# Patient Record
Sex: Female | Born: 1980
Health system: Southern US, Community
[De-identification: ages and names within clinical notes are randomized; demographics above are authoritative.]

## PROBLEM LIST (undated history)

## (undated) DIAGNOSIS — B019 Varicella without complication: Secondary | ICD-10-CM

## (undated) DIAGNOSIS — E559 Vitamin D deficiency, unspecified: Secondary | ICD-10-CM

## (undated) DIAGNOSIS — E063 Autoimmune thyroiditis: Secondary | ICD-10-CM

## (undated) DIAGNOSIS — E079 Disorder of thyroid, unspecified: Secondary | ICD-10-CM

## (undated) DIAGNOSIS — M545 Low back pain, unspecified: Secondary | ICD-10-CM

## (undated) DIAGNOSIS — R51 Headache: Secondary | ICD-10-CM

## (undated) DIAGNOSIS — E669 Obesity, unspecified: Secondary | ICD-10-CM

## (undated) DIAGNOSIS — K760 Fatty (change of) liver, not elsewhere classified: Secondary | ICD-10-CM

## (undated) DIAGNOSIS — M542 Cervicalgia: Secondary | ICD-10-CM

## (undated) DIAGNOSIS — M719 Bursopathy, unspecified: Secondary | ICD-10-CM

## (undated) DIAGNOSIS — R519 Headache, unspecified: Secondary | ICD-10-CM

## (undated) DIAGNOSIS — G8929 Other chronic pain: Secondary | ICD-10-CM

## (undated) HISTORY — DX: Bursopathy, unspecified: M71.9

## (undated) HISTORY — DX: Headache: R51

## (undated) HISTORY — DX: Low back pain, unspecified: M54.50

## (undated) HISTORY — PX: NO PAST SURGERIES: SHX2092

## (undated) HISTORY — DX: Vitamin D deficiency, unspecified: E55.9

## (undated) HISTORY — DX: Other chronic pain: G89.29

## (undated) HISTORY — DX: Headache, unspecified: R51.9

## (undated) HISTORY — DX: Varicella without complication: B01.9

## (undated) HISTORY — DX: Cervicalgia: M54.2

## (undated) HISTORY — DX: Fatty (change of) liver, not elsewhere classified: K76.0

## (undated) HISTORY — DX: Autoimmune thyroiditis: E06.3

## (undated) HISTORY — DX: Low back pain: M54.5

## (undated) SURGERY — Surgical Case
Anesthesia: Spinal

---

## 2015-02-08 ENCOUNTER — Other Ambulatory Visit: Payer: Self-pay | Admitting: Obstetrics & Gynecology

## 2015-07-09 ENCOUNTER — Inpatient Hospital Stay: Payer: 59 | Admitting: Anesthesiology

## 2015-07-09 ENCOUNTER — Inpatient Hospital Stay
Admission: EM | Admit: 2015-07-09 | Discharge: 2015-07-12 | DRG: 765 | Disposition: A | Discharge status: Home / Self Care | Payer: 59 | Attending: Obstetrics and Gynecology | Admitting: Obstetrics and Gynecology

## 2015-07-09 ENCOUNTER — Encounter: Payer: Self-pay | Admitting: Certified Nurse Midwife

## 2015-07-09 ENCOUNTER — Encounter
Admission: EM | Disposition: A | Discharge status: Home / Self Care | Payer: Self-pay | Source: Home / Self Care | Attending: Obstetrics and Gynecology

## 2015-07-09 DIAGNOSIS — O99824 Streptococcus B carrier state complicating childbirth: Secondary | ICD-10-CM | POA: Diagnosis present

## 2015-07-09 DIAGNOSIS — O4290 Premature rupture of membranes, unspecified as to length of time between rupture and onset of labor, unspecified weeks of gestation: Secondary | ICD-10-CM | POA: Diagnosis present

## 2015-07-09 DIAGNOSIS — O99214 Obesity complicating childbirth: Secondary | ICD-10-CM | POA: Diagnosis present

## 2015-07-09 DIAGNOSIS — O9081 Anemia of the puerperium: Secondary | ICD-10-CM | POA: Diagnosis not present

## 2015-07-09 DIAGNOSIS — Z6841 Body Mass Index (BMI) 40.0 and over, adult: Secondary | ICD-10-CM | POA: Diagnosis not present

## 2015-07-09 DIAGNOSIS — E669 Obesity, unspecified: Secondary | ICD-10-CM | POA: Diagnosis present

## 2015-07-09 DIAGNOSIS — Z3A37 37 weeks gestation of pregnancy: Secondary | ICD-10-CM

## 2015-07-09 DIAGNOSIS — Z3A38 38 weeks gestation of pregnancy: Secondary | ICD-10-CM

## 2015-07-09 DIAGNOSIS — D62 Acute posthemorrhagic anemia: Secondary | ICD-10-CM | POA: Diagnosis not present

## 2015-07-09 DIAGNOSIS — O4292 Full-term premature rupture of membranes, unspecified as to length of time between rupture and onset of labor: Secondary | ICD-10-CM | POA: Diagnosis present

## 2015-07-09 HISTORY — DX: Obesity, unspecified: E66.9

## 2015-07-09 LAB — CBC
HCT: 35.8 % (ref 35.0–47.0)
Hemoglobin: 11.6 g/dL — ABNORMAL LOW (ref 12.0–16.0)
MCH: 27.6 pg (ref 26.0–34.0)
MCHC: 32.4 g/dL (ref 32.0–36.0)
MCV: 85.3 fL (ref 80.0–100.0)
PLATELETS: 260 10*3/uL (ref 150–440)
RBC: 4.2 MIL/uL (ref 3.80–5.20)
RDW: 14.1 % (ref 11.5–14.5)
WBC: 11.6 10*3/uL — ABNORMAL HIGH (ref 3.6–11.0)

## 2015-07-09 LAB — ABO/RH: ABO/RH(D): A POS

## 2015-07-09 LAB — TYPE AND SCREEN
ABO/RH(D): A POS
Antibody Screen: NEGATIVE

## 2015-07-09 SURGERY — Surgical Case
Anesthesia: Spinal | Wound class: Clean Contaminated

## 2015-07-09 MED ORDER — DIPHENHYDRAMINE HCL 50 MG/ML IJ SOLN: 12.5000 mg | INTRAMUSCULAR | Status: DC | PRN

## 2015-07-09 MED ORDER — LACTATED RINGERS IV SOLN
500.0000 mL | INTRAVENOUS | Status: DC | PRN
  Administered 2015-07-09: 1000 mL via INTRAVENOUS

## 2015-07-09 MED ORDER — OXYTOCIN 40 UNITS IN LACTATED RINGERS INFUSION - SIMPLE MED: 62.5000 mL/h | INTRAVENOUS | Status: DC

## 2015-07-09 MED ORDER — SIMETHICONE 80 MG PO CHEW
80.0000 mg | CHEWABLE_TABLET | ORAL | Status: DC
  Administered 2015-07-09 – 2015-07-10 (×2): 80 mg via ORAL
  Filled 2015-07-09 (×3): qty 1

## 2015-07-09 MED ORDER — OXYTOCIN BOLUS FROM INFUSION: 500.0000 mL | INTRAVENOUS | Status: DC

## 2015-07-09 MED ORDER — LACTATED RINGERS IV SOLN
INTRAVENOUS | Status: DC
  Administered 2015-07-10: 01:00:00 via INTRAVENOUS

## 2015-07-09 MED ORDER — BUPIVACAINE HCL (PF) 0.5 % IJ SOLN
INTRAMUSCULAR | Status: AC
  Filled 2015-07-09: qty 30

## 2015-07-09 MED ORDER — BUPIVACAINE IN DEXTROSE 0.75-8.25 % IT SOLN
INTRATHECAL | Status: DC | PRN
  Administered 2015-07-09: 1.8 mL via INTRATHECAL

## 2015-07-09 MED ORDER — NALBUPHINE HCL 10 MG/ML IJ SOLN
5.0000 mg | Freq: Once | INTRAMUSCULAR | Status: DC | PRN
  Filled 2015-07-09: qty 0.5

## 2015-07-09 MED ORDER — ONDANSETRON HCL 4 MG/2ML IJ SOLN: 4.0000 mg | Freq: Three times a day (TID) | INTRAMUSCULAR | Status: DC | PRN

## 2015-07-09 MED ORDER — PRENATAL MULTIVITAMIN CH
1.0000 | ORAL_TABLET | Freq: Every day | ORAL | Status: DC
  Administered 2015-07-10 – 2015-07-12 (×3): 1 via ORAL
  Filled 2015-07-09 (×3): qty 1

## 2015-07-09 MED ORDER — NALBUPHINE HCL 10 MG/ML IJ SOLN
5.0000 mg | INTRAMUSCULAR | Status: DC | PRN
  Filled 2015-07-09: qty 0.5

## 2015-07-09 MED ORDER — LACTATED RINGERS IV SOLN
INTRAVENOUS | Status: DC
Start: 2015-07-09 — End: 2015-07-09

## 2015-07-09 MED ORDER — BUPIVACAINE HCL (PF) 0.5 % IJ SOLN
INTRAMUSCULAR | Status: DC | PRN
  Administered 2015-07-09: 10 mL

## 2015-07-09 MED ORDER — ONDANSETRON HCL 4 MG/2ML IJ SOLN
4.0000 mg | Freq: Four times a day (QID) | INTRAMUSCULAR | Status: DC | PRN
  Administered 2015-07-09: 4 mg via INTRAVENOUS

## 2015-07-09 MED ORDER — CEFAZOLIN SODIUM-DEXTROSE 2-3 GM-% IV SOLR
INTRAVENOUS | Status: AC
  Filled 2015-07-09: qty 50

## 2015-07-09 MED ORDER — MEPERIDINE HCL 25 MG/ML IJ SOLN: 6.2500 mg | INTRAMUSCULAR | Status: DC | PRN

## 2015-07-09 MED ORDER — SIMETHICONE 80 MG PO CHEW
80.0000 mg | CHEWABLE_TABLET | ORAL | Status: DC | PRN
  Administered 2015-07-12 (×2): 80 mg via ORAL

## 2015-07-09 MED ORDER — DIPHENHYDRAMINE HCL 25 MG PO CAPS: 25.0000 mg | ORAL_CAPSULE | Freq: Four times a day (QID) | ORAL | Status: DC | PRN

## 2015-07-09 MED ORDER — PHENYLEPHRINE HCL 10 MG/ML IJ SOLN
INTRAMUSCULAR | Status: DC | PRN
  Administered 2015-07-09 (×2): 100 ug via INTRAVENOUS

## 2015-07-09 MED ORDER — INFLUENZA VAC SPLIT QUAD 0.5 ML IM SUSY: 0.5000 mL | PREFILLED_SYRINGE | INTRAMUSCULAR | Status: DC

## 2015-07-09 MED ORDER — DIPHENHYDRAMINE HCL 25 MG PO CAPS: 25.0000 mg | ORAL_CAPSULE | ORAL | Status: DC | PRN

## 2015-07-09 MED ORDER — SODIUM CHLORIDE 0.9 % IJ SOLN: 3.0000 mL | INTRAMUSCULAR | Status: DC | PRN

## 2015-07-09 MED ORDER — SIMETHICONE 80 MG PO CHEW
80.0000 mg | CHEWABLE_TABLET | Freq: Three times a day (TID) | ORAL | Status: DC
  Administered 2015-07-10 – 2015-07-12 (×6): 80 mg via ORAL
  Filled 2015-07-09 (×7): qty 1

## 2015-07-09 MED ORDER — BUPIVACAINE HCL 0.5 % IJ SOLN
10.0000 mL | Freq: Once | INTRAMUSCULAR | Status: DC
  Filled 2015-07-09: qty 10

## 2015-07-09 MED ORDER — NALOXONE HCL 0.4 MG/ML IJ SOLN: 0.4000 mg | INTRAMUSCULAR | Status: DC | PRN

## 2015-07-09 MED ORDER — SCOPOLAMINE 1 MG/3DAYS TD PT72: 1.0000 | MEDICATED_PATCH | Freq: Once | TRANSDERMAL | Status: DC

## 2015-07-09 MED ORDER — CEFAZOLIN SODIUM-DEXTROSE 2-3 GM-% IV SOLR: 2.0000 g | INTRAVENOUS | Status: DC

## 2015-07-09 MED ORDER — WITCH HAZEL-GLYCERIN EX PADS: 1.0000 "application " | MEDICATED_PAD | CUTANEOUS | Status: DC | PRN

## 2015-07-09 MED ORDER — MENTHOL 3 MG MT LOZG: 1.0000 | LOZENGE | OROMUCOSAL | Status: DC | PRN

## 2015-07-09 MED ORDER — MORPHINE SULFATE (PF) 0.5 MG/ML IJ SOLN
INTRAMUSCULAR | Status: DC | PRN
  Administered 2015-07-09: .2 mg via EPIDURAL

## 2015-07-09 MED ORDER — CITRIC ACID-SODIUM CITRATE 334-500 MG/5ML PO SOLN
30.0000 mL | ORAL | Status: AC
  Administered 2015-07-09: 30 mL via ORAL

## 2015-07-09 MED ORDER — IBUPROFEN 600 MG PO TABS
600.0000 mg | ORAL_TABLET | Freq: Four times a day (QID) | ORAL | Status: DC | PRN
  Administered 2015-07-10 – 2015-07-12 (×9): 600 mg via ORAL
  Filled 2015-07-09 (×9): qty 1

## 2015-07-09 MED ORDER — BUPIVACAINE 0.25 % ON-Q PUMP DUAL CATH 400 ML
400.0000 mL | INJECTION | Status: DC
  Filled 2015-07-09: qty 400

## 2015-07-09 MED ORDER — DIBUCAINE 1 % RE OINT: 1.0000 "application " | TOPICAL_OINTMENT | RECTAL | Status: DC | PRN

## 2015-07-09 MED ORDER — SENNOSIDES-DOCUSATE SODIUM 8.6-50 MG PO TABS
2.0000 | ORAL_TABLET | ORAL | Status: DC
  Administered 2015-07-09 – 2015-07-12 (×3): 2 via ORAL
  Filled 2015-07-09 (×3): qty 2

## 2015-07-09 MED ORDER — FENTANYL CITRATE (PF) 100 MCG/2ML IJ SOLN: 25.0000 ug | INTRAMUSCULAR | Status: DC | PRN

## 2015-07-09 MED ORDER — OXYTOCIN 40 UNITS IN LACTATED RINGERS INFUSION - SIMPLE MED
62.5000 mL/h | INTRAVENOUS | Status: DC
  Administered 2015-07-09: 800 mL via INTRAVENOUS
  Filled 2015-07-09: qty 1000

## 2015-07-09 MED ORDER — ONDANSETRON HCL 4 MG/2ML IJ SOLN: 4.0000 mg | Freq: Once | INTRAMUSCULAR | Status: DC | PRN

## 2015-07-09 MED ORDER — EPHEDRINE SULFATE 50 MG/ML IJ SOLN
INTRAMUSCULAR | Status: DC | PRN
  Administered 2015-07-09 (×2): 5 mg via INTRAVENOUS
  Administered 2015-07-09: 10 mg via INTRAVENOUS

## 2015-07-09 MED ORDER — ACETAMINOPHEN 325 MG PO TABS
650.0000 mg | ORAL_TABLET | ORAL | Status: DC | PRN
  Administered 2015-07-10 – 2015-07-12 (×7): 650 mg via ORAL
  Filled 2015-07-09 (×8): qty 2

## 2015-07-09 MED ORDER — NALOXONE HCL 2 MG/2ML IJ SOSY
1.0000 ug/kg/h | PREFILLED_SYRINGE | INTRAVENOUS | Status: DC | PRN
  Filled 2015-07-09: qty 2

## 2015-07-09 MED ORDER — MIDAZOLAM HCL 2 MG/2ML IJ SOLN
INTRAMUSCULAR | Status: DC | PRN
  Administered 2015-07-09 (×2): 1 mg via INTRAVENOUS

## 2015-07-09 MED ORDER — LANOLIN HYDROUS EX OINT: 1.0000 "application " | TOPICAL_OINTMENT | CUTANEOUS | Status: DC | PRN

## 2015-07-09 MED ORDER — CITRIC ACID-SODIUM CITRATE 334-500 MG/5ML PO SOLN
30.0000 mL | ORAL | Status: DC | PRN
  Filled 2015-07-09: qty 30

## 2015-07-09 SURGICAL SUPPLY — 28 items
BAG COUNTER SPONGE EZ (MISCELLANEOUS) ×2 IMPLANT
CANISTER SUCT 3000ML (MISCELLANEOUS) ×3 IMPLANT
CATH KIT ON-Q SILVERSOAK 5IN (CATHETERS) ×6 IMPLANT
CHLORAPREP W/TINT 26ML (MISCELLANEOUS) ×6 IMPLANT
CLOSURE WOUND 1/2 X4 (GAUZE/BANDAGES/DRESSINGS) ×1
COUNTER SPONGE BAG EZ (MISCELLANEOUS) ×1
DRSG TELFA 3X8 NADH (GAUZE/BANDAGES/DRESSINGS) ×3 IMPLANT
ELECT CAUTERY BLADE 6.4 (BLADE) ×3 IMPLANT
GAUZE SPONGE 4X4 12PLY STRL (GAUZE/BANDAGES/DRESSINGS) ×3 IMPLANT
GLOVE BIO SURGEON STRL SZ7 (GLOVE) ×3 IMPLANT
GLOVE INDICATOR 7.5 STRL GRN (GLOVE) ×3 IMPLANT
GOWN STRL REUS W/ TWL LRG LVL3 (GOWN DISPOSABLE) ×3 IMPLANT
GOWN STRL REUS W/TWL LRG LVL3 (GOWN DISPOSABLE) ×6
LIQUID BAND (GAUZE/BANDAGES/DRESSINGS) ×3 IMPLANT
NS IRRIG 1000ML POUR BTL (IV SOLUTION) ×3 IMPLANT
PACK C SECTION AR (MISCELLANEOUS) ×3 IMPLANT
PAD GROUND ADULT SPLIT (MISCELLANEOUS) ×3 IMPLANT
PAD OB MATERNITY 4.3X12.25 (PERSONAL CARE ITEMS) ×3 IMPLANT
PAD PREP 24X41 OB/GYN DISP (PERSONAL CARE ITEMS) ×3 IMPLANT
RTRCTR C-SECT PINK 25CM LRG (MISCELLANEOUS) ×3 IMPLANT
STAPLER INSORB 30 2030 C-SECTI (MISCELLANEOUS) ×3 IMPLANT
STRIP CLOSURE SKIN 1/2X4 (GAUZE/BANDAGES/DRESSINGS) ×2 IMPLANT
SUT MNCRL AB 4-0 PS2 18 (SUTURE) ×3 IMPLANT
SUT PDS AB 1 TP1 96 (SUTURE) ×6 IMPLANT
SUT VIC AB 0 CTX 36 (SUTURE) ×4
SUT VIC AB 0 CTX36XBRD ANBCTRL (SUTURE) ×2 IMPLANT
SUT VIC AB 2-0 CT1 36 (SUTURE) ×3 IMPLANT
SYR 5ML 18GX1 1/2 (NEEDLE) ×3 IMPLANT

## 2015-07-09 NOTE — Anesthesia Procedure Notes (Signed)
Spinal Patient location during procedure: OR Start time: 07/09/2015 9:50 AM End time: 07/09/2015 10:03 AM Staffing Anesthesiologist: Elijio MilesVAN STAVEREN, GIJSBERTUS F Performed by: anesthesiologist  Preanesthetic Checklist Completed: patient identified, site marked, surgical consent, pre-op evaluation, timeout performed, IV checked, risks and benefits discussed and monitors and equipment checked Spinal Block Patient position: sitting Prep: Betadine Patient monitoring: heart rate and blood pressure Location: L3-4 Injection technique: single-shot Needle Needle type: Quincke  Needle gauge: 25 G Needle length: 9 cm Needle insertion depth: 7 cm Assessment Sensory level: T6

## 2015-07-09 NOTE — Anesthesia Preprocedure Evaluation (Signed)
Anesthesia Evaluation  Patient identified by MRN, date of birth, ID band Patient awake    Reviewed: Allergy & Precautions, NPO status , Patient's Chart, lab work & pertinent test results  Airway Mallampati: II       Dental no notable dental hx.    Pulmonary neg pulmonary ROS,    Pulmonary exam normal        Cardiovascular negative cardio ROS Normal cardiovascular exam     Neuro/Psych negative neurological ROS     GI/Hepatic negative GI ROS, Neg liver ROS,   Endo/Other  negative endocrine ROS  Renal/GU negative Renal ROS     Musculoskeletal negative musculoskeletal ROS (+)   Abdominal Normal abdominal exam  (+)   Peds negative pediatric ROS (+)  Hematology negative hematology ROS (+)   Anesthesia Other Findings   Reproductive/Obstetrics negative OB ROS                             Anesthesia Physical Anesthesia Plan  ASA: II  Anesthesia Plan: Spinal   Post-op Pain Management:    Induction:   Airway Management Planned: Nasal Cannula  Additional Equipment:   Intra-op Plan:   Post-operative Plan:   Informed Consent: I have reviewed the patients History and Physical, chart, labs and discussed the procedure including the risks, benefits and alternatives for the proposed anesthesia with the patient or authorized representative who has indicated his/her understanding and acceptance.     Plan Discussed with: CRNA  Anesthesia Plan Comments:         Anesthesia Quick Evaluation

## 2015-07-09 NOTE — Transfer of Care (Signed)
Immediate Anesthesia Transfer of Care Note  Patient: Dana BubaStephanie D Haynes  Procedure(s) Performed: Procedure(s): CESAREAN SECTION (N/A)  Patient Location: Mother/Baby  Anesthesia Type:Spinal  Level of Consciousness: awake, alert , oriented and patient cooperative  Airway & Oxygen Therapy: Patient Spontanous Breathing  Post-op Assessment: Report given to RN, Post -op Vital signs reviewed and stable and Patient moving all extremities X 4  Post vital signs: Reviewed and stable  Last Vitals:  Filed Vitals:   07/09/15 1110  BP: 98/58  Pulse: 78  Temp: 36.4 C  Resp: 16    Complications: No apparent anesthesia complications

## 2015-07-09 NOTE — Progress Notes (Signed)
Patient present with PPROM at 37 weeks this morning.  Fetal monitoring revealed significant PAC's precluding adequate fetal monitoring because of artifact.   Given G1 in early stage of labor, inability to provide reassurance on fetal well being as labor progresses recommendation was for primary LTCS with patient in agreement.  The patient did have a normal fetal echo for paternal history of child with another partner with congential heart defect.  Good fetal movement reported by patient and felt by provider

## 2015-07-09 NOTE — Progress Notes (Signed)
Patient ordered Cefazolin pre-op with Pencillin allergy (hives).  Checked with provider- states continue as ordered.

## 2015-07-09 NOTE — H&P (Signed)
OB History & Physical   History of Present Illness:  Chief Complaint: My bag of water broke at 6AM. Denies contractions. Having some cramping  HPI:  Dana Brock is a 34 y.o. G1P0000 female with EDC11/05/2015 at 37.6 weeks dated by a 7wk1d ultrasound.  Her pregnancy has been complicated by obesity with current BMI=40.9, and elevated 1 hr GTT at 28 weeks of 142 and a normal 3 hr GTT, and FOB's history of a son with a congenital heart defect. This baby's fetal echo was WNL..  She presents to L&D for evaluation of leakage of clear fluid.    [facility]       Maternal Medical History:   Past Medical History  Diagnosis Date  . Obesity     Past Surgical History  Procedure Laterality Date  . No past surgeries      Allergies  Allergen Reactions  . Penicillins Hives    Prior to Admission medications   Not on File  Tums        Social History: She  reports that she has never smoked. She does not have any smokeless tobacco history on file. She reports that she does not drink alcohol or use illicit drugs.  Family History: family history is not on file.   Review of Systems: Negative x 10 systems reviewed except as noted in the HPI.      Physical Exam:  Vital Signs: BP 104/56 mmHg  Pulse 89  Temp(Src) 98.8 F (37.1 C) (Oral)  Resp 16  SpO2 100% General: no acute distress.  HEENT: normocephalic, atraumatic Heart: regular rate & rhythm.  No murmurs Lungs: clear to auscultation bilaterally Abdomen: soft, gravid, non-tender;  EFW:8# Pelvic:   External: Normal external female genitalia  Cervix: 1/60%/-3    ROM: + Nitrazine Extremities: non-tender, symmetric, trace edema bilaterally.  DTRs: +1 Neurologic: Alert & oriented x 3.    Pertinent Results:  Prenatal Labs: Blood type/Rh A positive  Antibody screen Not done  Rubella Varicella Immune Immune  RPR negative  HBsAg Not done  HIV negative  GC negative  Chlamydia  negative  Genetic screening declines  1 hour GTT 142  3 hour GTT WNL  GBS positive on 10/17   Baseline FHR: frequent irregularity to FHR, ?baseline 140 with possible decelerations to 80s Bedside Ultrasound:  Number of Fetus:one  Presentation: LOA    Assessment:  Dana Brock is a 34 y.o. G1P0000 female at 37.6 weeks with PROM and fetal heart rate irregularity   Plan:  1. Admit to Labor & Delivery - notified attending  Of FHR pattern 2. CBC, T&S,HBSAG, IVF 3. GBS positive.  Will use Kefzol 4. Consents obtained. 5. NPO  Mikel Hardgrove  07/09/2015 9:26 AM

## 2015-07-09 NOTE — Progress Notes (Signed)
Upon admission to unit, pt assessed and external monitor applied. Fetal heart tracing within normal range, this RN at bedside and arrhythmia heard during assessment. SVE performed, unable to reach cervix. Pt here with SROM at 0600, clear fluid. At 0840, C. Sharen HonesGutierrez, CNM at bedside for U/S, SVE and assessment. CLG req assistance with IV start for audible deceleration. RNs to room to start IV and assist CLG with FSE placement.  At 0900, A. Bonney AidStaebler, MD at bedside to assess FHR arrhythmia

## 2015-07-09 NOTE — Discharge Summary (Signed)
Obstetric Discharge Summary Reason for Admission: PROM Prenatal Procedures: NST Intrapartum Procedures: LTCS Postpartum Procedures: none Complications-Operative and Postpartum: none HEMOGLOBIN  Date Value Ref Range Status  07/10/2015 9.8* 12.0 - 16.0 g/dL Final   HCT  Date Value Ref Range Status  07/10/2015 29.9* 35.0 - 47.0 % Final    Physical Exam:  General: alert, appears stated age and no distress Lochia: appropriate Uterine Fundus: firm. C/d/i incision with dermabond and  with on q pump in place Incision: healing well DVT Evaluation: No evidence of DVT seen on physical exam.  Discharge Diagnoses: Term Pregnancy-delivered  Discharge Information: Date: 07/12/2015 Activity: no intercourse for 6 weeks, no heavy lifting greater than 10lbs for 6 weeks, no driving while taking narcotics or in the first 2 weeks after surgery Diet: routine   Medication List    TAKE these medications        calcium carbonate 500 MG chewable tablet  Commonly known as:  TUMS - dosed in mg elemental calcium  Chew 1 tablet by mouth 3 (three) times daily as needed for indigestion or heartburn.     docusate sodium 100 MG capsule  Commonly known as:  COLACE  Take 1 capsule (100 mg total) by mouth 2 (two) times daily as needed for mild constipation or moderate constipation.     ibuprofen 600 MG tablet  Commonly known as:  ADVIL,MOTRIN  Take 1 tablet (600 mg total) by mouth every 6 (six) hours as needed for mild pain.     oxyCODONE-acetaminophen 5-325 MG tablet  Commonly known as:  ROXICET  Take 1-2 tablets by mouth every 6 (six) hours as needed for severe pain.        Condition: stable Discharge to: home Follow-up Information    Follow up with Lorrene ReidSTAEBLER, ANDREAS M, MD In 1 week.   Specialty:  Obstetrics and Gynecology   Why:  For wound re-check, For Post Op   Contact information:   [address] [place] [address] [phone]       Follow up with Lorrene ReidSTAEBLER, ANDREAS M, MD In  1 week.   Specialty:  Obstetrics and Gynecology   Why:  For wound re-check   Contact information:   [address] [place] [address] [phone]       Newborn Data: Live born female  Birth Weight: 8 lb 5.7 oz (3790 g) APGAR: 9, 9  Home with mother.  Cornelia Copaharlie Cassondra Stachowski, Jr MD [facility]  Pager: [phone]

## 2015-07-09 NOTE — Op Note (Signed)
Preoperative Diagnosis: 1) 34 y.o. G1P1001 at 524w1d 2) Fetal arythmia  Postoperative Diagnosis: 1) 34 y.o. G1P1001 at 2324w1d 2) Fetal arythmia  Operation Performed: Primary low transverse C-section via pfannenstiel skin incision  Indication:Patient presented with PROM, significant PACs with possible decelerations noted on monitoring which precluded adequate assessment of fetal well being.  Anesthesia: Spinal  Primary Surgeon: Vena AustriaAndreas Demesha Boorman, MD  Assistant: Dalene Carrowolleen Guitierez, CNM  Preoperative Antibiotics: 2g ancef  Estimated Blood Loss: 500mL  IV Fluids: 1800mL  Urine Output:: 700mL  Drains or Tubes: Foley to gravity drainage, ON-Q catheter system  Implants: none  Specimens Removed: none  Complications: none  Intraoperative Findings:  Normal tubes ovaries and uterus.  Delivery at 10:11 resulted in the birth of a liveborn female infant (Olivia) APGAR (1 MIN): 9   APGAR (5 MINS): 9   Weight  3790g 8lbs 6oz  Patient Condition: stable  Procedure in Detail:  Patient was taken to the operating room were she was administered regional anesthesia.  She was positioned in the supine position, prepped and draped in the  Usual sterile fashion.  Prior to proceeding with the case a time out was performed and the level of anesthetic was checked and noted to be adequate.  Utilizing the scalpel a pfannenstiel skin incision was made 2cm above the pubic symphysis and carried down sharply to the the level of the rectus fascia.  The fascia was incised in the midline using the scalpel and then extended using mayo scissors.  The superior border of the rectus fascia was grasped with two Kocher clamps and the underlying rectus muscles were dissected of the fascia using blunt dissection.  The median raphae was incised using Mayo scissors.   The inferior border of the rectus fascia was dissected of the rectus muscles in a similar fashion.  The midline was identified, the peritoneum was entered  bluntly and expanded using manual tractions.  The uterus was noted to be in a none rotated position.  An large Teacher, early years/preAlexis retractor was placed.  A bladder flap was not created.  A low transverse incision was scored on the lower uterine segment.  The hysterotomy was entered bluntly using the operators finger.  The hysterotomy incision was extended using manual traction.  The operators hand was placed within the hysterotomy position noting the fetus to be within the OA position.  The vertex was grasped, flexed, brought to the incision, and delivered a traumatically using fundal pressure.  The remainder of the body delivered with ease.  The infant was suctioned, cord was clamped and cut before handing off to the awaiting neonatologist.  The placenta was delivered using manual extraction.  The uterus was exteriorized, wiped clean of clots and debris using two moist laps.  The hysterotomy was closed using a two layer closure of 0 Vicryl, with the first being a running locked, the second a vertical imbricating.  The uterus was returned to the abdomen.  The peritoneal gutters were wiped clean of clots and debris using two moist laps.  The hysterotomy incision was re-inspected noted to be hemostatic.  The rectus muscles were re-approximated in the midline using a single 2-0 Vicryl mattress stitch.  The rectus muscles were inspected noted to be hemostatic.  The superior border of the rectus fascia was grasped with a Kocher clamp.  The ON-Q trocars were then placed 4cm above the superior border of the incision and tunneled subfascially.  The introducers were removed and the catheters were threaded through the sleeves after which the  sleeves were removed.  The fascia was closed using a looped #1 PDS in a running fashion taking 1cm by 1cm bites.  The subcutaneous tissue was irrigated using warm saline, hemostasis achieved using the bovis.  The subcutaneous dead space was greater 3cm and was closed.  The subcutaneous dead space was  obliterated by using a 53-T 0 Chromic in a running fashion.     The skin was closed using Insorb staples.  Sponge needle and instrument counts were corrects times two.  The patient tolerated the procedure well and was taken to the recovery room in stable condition.

## 2015-07-10 LAB — CBC
HEMATOCRIT: 29.9 % — AB (ref 35.0–47.0)
HEMOGLOBIN: 9.8 g/dL — AB (ref 12.0–16.0)
MCH: 28 pg (ref 26.0–34.0)
MCHC: 32.9 g/dL (ref 32.0–36.0)
MCV: 85.1 fL (ref 80.0–100.0)
PLATELETS: 199 10*3/uL (ref 150–440)
RBC: 3.51 MIL/uL — AB (ref 3.80–5.20)
RDW: 14.1 % (ref 11.5–14.5)
WBC: 10.9 10*3/uL (ref 3.6–11.0)

## 2015-07-10 LAB — RPR: RPR Ser Ql: NONREACTIVE

## 2015-07-10 LAB — HEPATITIS B SURFACE ANTIGEN: HEP B S AG: NEGATIVE

## 2015-07-10 NOTE — Anesthesia Postprocedure Evaluation (Signed)
  Anesthesia Post-op Note  Patient: Dana [name]  Procedure(s) Performed: Procedure(s): CESAREAN SECTION (N/A)  Anesthesia type:Spinal  Patient location: PACU  Post pain: Pain level controlled  Post assessment: Post-op Vital signs reviewed, Patient's Cardiovascular Status Stable, Respiratory Function Stable, Patent Airway and No signs of Nausea or vomiting  Post vital signs: Reviewed and stable  Last Vitals:  Filed Vitals:   07/10/15 0735  BP: 117/61  Pulse: 77  Temp: 36.9 C  Resp: 18    Level of consciousness: awake, alert  and patient cooperative  Complications: No apparent anesthesia complications

## 2015-07-10 NOTE — Progress Notes (Signed)
Admit Date: 07/09/2015 Today's Date: 07/10/2015  Subjective: Postpartum Day 1: Cesarean Delivery Patient reports incisional pain, tolerating PO, + flatus and no problems voiding.    Objective: Vital signs in last 24 hours: Temp:  [97.6 F (36.4 C)-98.7 F (37.1 C)] 98.5 F (36.9 C) (10/26 0735) Pulse Rate:  [74-98] 77 (10/26 0735) Resp:  [16-20] 18 (10/26 0735) BP: (89-121)/(42-75) 117/61 mmHg (10/26 0735) SpO2:  [97 %-100 %] 98 % (10/26 0735) Weight:  [261 lb (118.389 kg)] 261 lb (118.389 kg) (10/25 1330)  Physical Exam:  General: alert, cooperative and no distress Lochia: appropriate Uterine Fundus: firm Incision: no significant drainage, no significant erythema, dressing clean and dry DVT Evaluation: No evidence of DVT seen on physical exam.   Recent Labs  07/09/15 0910 07/10/15 0448  HGB 11.6* 9.8*  HCT 35.8 29.9*    Assessment/Plan: Status post Cesarean section. Doing well postoperatively.  Continue current care. Monitor anemia; cont iron/PNV Uncertain contraception Breast feeding  Jennyfer Nickolson PAUL 07/10/2015, 9:10 AM

## 2015-07-10 NOTE — Anesthesia Post-op Follow-up Note (Signed)
  Anesthesia Pain Follow-up Note  Patient: Dana BubaStephanie D Haynes  Day #: 1  Date of Follow-up: 07/10/2015 Time: 9:54 AM  Last Vitals:  Filed Vitals:   07/10/15 0735  BP: 117/61  Pulse: 77  Temp: 36.9 C  Resp: 18    Level of Consciousness: alert  Pain: none   Side Effects:None  Catheter Site Exam:clean  Plan: D/C from anesthesia care  Evangeline Utley,  Wynona LunaPierre A

## 2015-07-11 MED ORDER — DOCUSATE SODIUM 100 MG PO CAPS
100.0000 mg | ORAL_CAPSULE | Freq: Every day | ORAL | Status: DC
  Administered 2015-07-11 – 2015-07-12 (×2): 100 mg via ORAL
  Filled 2015-07-11 (×2): qty 1

## 2015-07-11 MED ORDER — FERROUS FUMARATE 325 (106 FE) MG PO TABS
1.0000 | ORAL_TABLET | Freq: Every day | ORAL | Status: DC
  Administered 2015-07-11 – 2015-07-12 (×2): 1 via ORAL
  Filled 2015-07-11 (×2): qty 1

## 2015-07-11 NOTE — Lactation Note (Signed)
This note was copied from the chart of Dana Brock.  Lactation Consultation Note  Patient Name: Dana Brock [id number]'s Date: 07/11/2015 Reason for consult: Follow-up assessment   Maternal Data   Mother will call for a feeding today. Feeding Feeding Type: Breast Fed Length of feed: 20 min                    Consult Status  ongoing    Trudee GripCarolyn P Ashrita Chrismer 07/11/2015, 1:02 PM

## 2015-07-11 NOTE — Progress Notes (Signed)
  Subjective:   Passing flatus. Getting up and down with minimal assistance. Voiding without difficulty. Breastfeeding going well.  Objective:  Blood pressure 122/61, pulse 81, temperature 98.8 F (37.1 C), temperature source Oral, resp. rate 18, height 5\' 7"  (1.702 m), weight 261 lb (118.389 kg), last menstrual period 10/15/2014, SpO2 100 %, unknown if currently breastfeeding.  General: NAD Pulmonary: no increased work of breathing, CTA Abdomen: non-distended, non-tender,BS active. Incision: dsg C+D+I, ON Q intact Extremities: no edema, no erythema, no tenderness  Results for orders placed or performed during the hospital encounter of 07/09/15 (from the past 72 hour(s))  CBC     Status: Abnormal   Collection Time: 07/09/15  9:10 AM  Result Value Ref Range   WBC 11.6 (H) 3.6 - 11.0 K/uL   RBC 4.20 3.80 - 5.20 MIL/uL   Hemoglobin 11.6 (L) 12.0 - 16.0 g/dL   HCT 16.135.8 09.635.0 - 04.547.0 %   MCV 85.3 80.0 - 100.0 fL   MCH 27.6 26.0 - 34.0 pg   MCHC 32.4 32.0 - 36.0 g/dL   RDW 40.914.1 81.111.5 - 91.414.5 %   Platelets 260 150 - 440 K/uL  Type and screen [facility]     Status: None   Collection Time: 07/09/15  9:10 AM  Result Value Ref Range   ABO/RH(D) A POS    Antibody Screen NEG    Sample Expiration 07/12/2015   RPR     Status: None   Collection Time: 07/09/15  9:10 AM  Result Value Ref Range   RPR Ser Ql Non Reactive Non Reactive    Comment: (NOTE) Performed At: [facility] [address] [place], [place] [id number] Mila HomerHancock William F MD [id number]:[phone]   Hepatitis B surface antigen     Status: None   Collection Time: 07/09/15  9:10 AM  Result Value Ref Range   Hepatitis B Surface Ag Negative Negative    Comment: (NOTE) Performed At: [facility] [address] [place], [place] [id number] Mila HomerHancock William F MD [id number]:[phone]   ABO/Rh     Status: None   Collection Time: 07/09/15  9:11 AM  Result Value Ref Range   ABO/RH(D) A POS   CBC     Status:  Abnormal   Collection Time: 07/10/15  4:48 AM  Result Value Ref Range   WBC 10.9 3.6 - 11.0 K/uL   RBC 3.51 (L) 3.80 - 5.20 MIL/uL   Hemoglobin 9.8 (L) 12.0 - 16.0 g/dL   HCT 03.429.9 (L) 74.235.0 - 59.547.0 %   MCV 85.1 80.0 - 100.0 fL   MCH 28.0 26.0 - 34.0 pg   MCHC 32.9 32.0 - 36.0 g/dL   RDW 63.814.1 75.611.5 - 43.314.5 %   Platelets 199 150 - 440 K/uL     Assessment:   34 y.o. G1P1001 postoperativeday # 2-stable  Plan:  1) Acute blood loss anemia - hemodynamically stable and asymptomatic - po ferrous sulfate and vitamins  2)  / A POS / Varicella immune// Rubella Immune  3) TDAP UTD-given 9/2  4) Breast  5) Disposition-home tomorrow  Farrel ConnersGUTIERREZ, Alayasia Breeding, CNM

## 2015-07-12 MED ORDER — DOCUSATE SODIUM 100 MG PO CAPS: 100.0000 mg | ORAL_CAPSULE | Freq: Two times a day (BID) | ORAL | Status: DC | PRN

## 2015-07-12 MED ORDER — OXYCODONE-ACETAMINOPHEN 5-325 MG PO TABS: 1.0000 | ORAL_TABLET | Freq: Four times a day (QID) | ORAL | Status: DC | PRN

## 2015-07-12 MED ORDER — IBUPROFEN 600 MG PO TABS: 600.0000 mg | ORAL_TABLET | Freq: Four times a day (QID) | ORAL | Status: DC | PRN

## 2015-07-12 NOTE — Discharge Instructions (Signed)
Cesarean Delivery, Care After  Refer to this sheet in the next few weeks. These instructions provide you with information on caring for yourself after your procedure. Your health care provider may also give you specific instructions. Your treatment has been planned according to current medical practices, but problems sometimes occur. Call your health care provider if you have any problems or questions after you go home.  HOME CARE INSTRUCTIONS   Only take over-the-counter or prescription medications as directed by your health care provider.   Do not drink alcohol, especially if you are breastfeeding or taking medication to relieve pain.   Do not chew or smoke tobacco.   Continue to use good perineal care. Good perineal care includes:    Wiping your perineum from front to back.    Keeping your perineum clean.   Check your surgical cut (incision) daily for increased redness, drainage, swelling, or separation of skin.   Clean your incision gently with soap and water every day, and then pat it dry. If your health care provider says it is okay, leave the incision uncovered. Use a bandage (dressing) if the incision is draining fluid or appears irritated. If the adhesive strips across the incision do not fall off within 7 days, carefully peel them off.   Hug a pillow when coughing or sneezing until your incision is healed. This helps to relieve pain.   Do not use tampons or douche until your health care provider says it is okay.   Shower, wash your hair, and take tub baths as directed by your health care provider.   Wear a well-fitting bra that provides breast support.   Limit wearing support panties or control-top hose.   Drink enough fluids to keep your urine clear or pale yellow.   Eat high-fiber foods such as whole grain cereals and breads, brown rice, beans, and fresh fruits and vegetables every day. These foods may help prevent or relieve constipation.   Resume activities such as climbing stairs,  driving, lifting, exercising, or traveling as directed by your health care provider.   Talk to your health care provider about resuming sexual activities. This is dependent upon your risk of infection, your rate of healing, and your comfort and desire to resume sexual activity.   Try to have someone help you with your household activities and your newborn for at least a few days after you leave the hospital.   Rest as much as possible. Try to rest or take a nap when your newborn is sleeping.   Increase your activities gradually.   Keep all of your scheduled postpartum appointments. It is very important to keep your scheduled follow-up appointments. At these appointments, your health care provider will be checking to make sure that you are healing physically and emotionally.  SEEK MEDICAL CARE IF:    You are passing large clots from your vagina. Save any clots to show your health care provider.   You have a foul smelling discharge from your vagina.   You have trouble urinating.   You are urinating frequently.   You have pain when you urinate.   You have a change in your bowel movements.   You have increasing redness, pain, or swelling near your incision.   You have pus draining from your incision.   Your incision is separating.   You have painful, hard, or reddened breasts.   You have a severe headache.   You have blurred vision or see spots.   You feel sad   or depressed.   You have thoughts of hurting yourself or your newborn.   You have questions about your care, the care of your newborn, or medications.   You are dizzy or light-headed.   You have a rash.   You have pain, redness, or swelling at the site of the removed intravenous access (IV) tube.   You have nausea or vomiting.   You stopped breastfeeding and have not had a menstrual period within 12 weeks of stopping.   You are not breastfeeding and have not had a menstrual period within 12 weeks of delivery.   You have a fever.  SEEK  IMMEDIATE MEDICAL CARE IF:   You have persistent pain.   You have chest pain.   You have shortness of breath.   You faint.   You have leg pain.   You have stomach pain.   Your vaginal bleeding saturates 2 or more sanitary pads in 1 hour.  MAKE SURE YOU:    Understand these instructions.   Will watch your condition.   Will get help right away if you are not doing well or get worse.     This information is not intended to replace advice given to you by your health care provider. Make sure you discuss any questions you have with your health care provider.     Document Released: 05/23/2002 Document Revised: 09/21/2014 Document Reviewed: 04/27/2012  [employer]vier Interactive Patient Education 2016 [employer]

## 2015-07-12 NOTE — Lactation Note (Signed)
This note was copied from the chart of [name]. Lactation Consultation Note  Patient Name: [name] ZOXWR'UToday's Date: 07/12/2015 Reason for consult: Follow-up assessment   Maternal Data Does the patient have breastfeeding experience prior to this delivery?: No Mom for d/c today, has [employer] insurance and given Medela pump advanced for home use  Feeding Feeding Type: Breast Milk Length of feed: 30 min  LATCH Score/Interventions Latch: Grasps breast easily, tongue down, lips flanged, rhythmical sucking.  Audible Swallowing: Spontaneous and intermittent Intervention(s): Skin to skin;Hand expression  Type of Nipple: Everted at rest and after stimulation Intervention(s): No intervention needed  Comfort (Breast/Nipple): Filling, red/small blisters or bruises, mild/mod discomfort  Problem noted: Filling;Mild/Moderate discomfort Interventions (Filling): Frequent nursing Interventions (Mild/moderate discomfort): Comfort gels  Hold (Positioning): No assistance needed to correctly position infant at breast.  LATCH Score: 9  Lactation Tools [facility]   Consult Status      Dyann KiefMarsha D Jerlisa Diliberto 07/12/2015, 12:15 PM

## 2015-07-12 NOTE — Progress Notes (Signed)
Discharge instructions reviewed with pt. Pt v/u of all instructions. ID bands of mom and infant matched. Escorted by auxillary via w/c in stable condition, infant in arms.  Ruta HindsKelly Kailen Hinkle, RN 07/12/15 1500

## 2015-07-12 NOTE — Progress Notes (Signed)
Daily Post Partum Note  Dana Brock is a 34 y.o. G1P1001  POD#3 s/p pLTCS @ 4828w1d for fetal arrhythmia .  Pregnancy c/b nothing  24hr/overnight events:  none  Subjective:  Meeting all PO goals including BM  Objective:     Current Vital Signs 24h Vital Sign Ranges  T 98 F (36.7 C) Temp  Avg: 98.5 F (36.9 C)  Min: 98 F (36.7 C)  Max: 98.8 F (37.1 C)  BP 125/73 mmHg BP  Min: 115/64  Max: 138/73  HR 70 Pulse  Avg: 76  Min: 70  Max: 84  RR 18 Resp  Avg: 19.7  Min: 18  Max: 23  SaO2 99 % Not Delivered SpO2  Avg: 99.7 %  Min: 99 %  Max: 100 %       24 Hour I/O Current Shift I/O  Time Ins Outs 10/27 0701 - 10/28 0700 In: 720 [P.O.:720] Out: -       General: NAD Abdomen: soft, nttp, nd, +BS, on q in place. C/d/i with dermabond Perineum: deferred Skin:  Warm and dry.  Cardiovascular:Regular rate and rhythm. Respiratory:  Clear to auscultation bilateral. Normal respiratory effort Extremities: no c/c/e  Medications Current Facility-Administered Medications  Medication Dose Route Frequency Provider Last Rate Last Dose  . acetaminophen (TYLENOL) tablet 650 mg  650 mg Oral Q4H PRN Vena AustriaAndreas Staebler, MD   650 mg at 07/11/15 2246  . bupivacaine 0.25 % ON-Q pump DUAL CATH 400 mL  400 mL Other Continuous Vena AustriaAndreas Staebler, MD   400 mL at 07/09/15 1116  . witch hazel-glycerin (TUCKS) pad 1 application  1 application Topical PRN Vena AustriaAndreas Staebler, MD       And  . dibucaine (NUPERCAINAL) 1 % rectal ointment 1 application  1 application Rectal PRN Vena AustriaAndreas Staebler, MD      . diphenhydrAMINE (BENADRYL) injection 12.5 mg  12.5 mg Intravenous Q4H PRN Gijsbertus F Darleene CleaverVan Staveren, MD       Or  . diphenhydrAMINE (BENADRYL) capsule 25 mg  25 mg Oral Q4H PRN Gijsbertus Georgana CurioF Van Staveren, MD      . diphenhydrAMINE (BENADRYL) capsule 25 mg  25 mg Oral Q6H PRN Vena AustriaAndreas Staebler, MD      . docusate sodium (COLACE) capsule 100 mg  100 mg Oral Daily Farrel Connersolleen Gutierrez, CNM   100 mg at 07/12/15 0925   . ferrous fumarate (HEMOCYTE - 106 mg FE) tablet 106 mg of iron  1 tablet Oral Daily Farrel Connersolleen Gutierrez, CNM   1 tablet at 07/12/15 0925  . ibuprofen (ADVIL,MOTRIN) tablet 600 mg  600 mg Oral Q6H PRN Gijsbertus Georgana CurioF Van Staveren, MD   600 mg at 07/12/15 0653  . Influenza vac split quadrivalent PF (FLUARIX) injection 0.5 mL  0.5 mL Intramuscular Tomorrow-1000 Vena AustriaAndreas Staebler, MD      . lactated ringers infusion   Intravenous Continuous Vena AustriaAndreas Staebler, MD 125 mL/hr at 07/10/15 0118    . lanolin ointment 1 application  1 application Topical PRN Vena AustriaAndreas Staebler, MD      . menthol-cetylpyridinium (CEPACOL) lozenge 3 mg  1 lozenge Oral Q2H PRN Vena AustriaAndreas Staebler, MD      . nalbuphine (NUBAIN) injection 5 mg  5 mg Intravenous Q4H PRN Gijsbertus Georgana CurioF Van Staveren, MD       Or  . nalbuphine (NUBAIN) injection 5 mg  5 mg Subcutaneous Q4H PRN Gijsbertus F Darleene CleaverVan Staveren, MD      . nalbuphine (NUBAIN) injection 5 mg  5 mg Intravenous Once PRN Gijsbertus  Georgana Curio, MD       Or  . nalbuphine (NUBAIN) injection 5 mg  5 mg Subcutaneous Once PRN Gijsbertus F Darleene Cleaver, MD      . naloxone [facility]) 2 mg in dextrose 5 % 250 mL infusion  1-4 mcg/kg/hr Intravenous Continuous PRN Gijsbertus F Darleene Cleaver, MD      . naloxone [facility]) injection 0.4 mg  0.4 mg Intravenous PRN Gijsbertus Georgana Curio, MD       And  . sodium chloride 0.9 % injection 3 mL  3 mL Intravenous PRN Gijsbertus F Darleene Cleaver, MD      . ondansetron [facility]) injection 4 mg  4 mg Intravenous Q8H PRN Gijsbertus Georgana Curio, MD      . ondansetron [facility]) injection 4 mg  4 mg Intravenous Once PRN Gijsbertus Georgana Curio, MD      . prenatal multivitamin tablet 1 tablet  1 tablet Oral Q1200 Vena Austria, MD   1 tablet at 07/12/15 [phone]  . scopolamine (TRANSDERM-SCOP) 1 MG/3DAYS 1.5 mg  1 patch Transdermal Once Gijsbertus F Darleene Cleaver, MD      . senna-docusate (Senokot-S) tablet 2 tablet  2 tablet Oral Q24H Vena Austria, MD   2 tablet  at 07/12/15 0335  . simethicone (MYLICON) chewable tablet 80 mg  80 mg Oral TID PC Vena Austria, MD   80 mg at 07/12/15 0925  . simethicone (MYLICON) chewable tablet 80 mg  80 mg Oral Q24H Vena Austria, MD   80 mg at 07/10/15 2335  . simethicone (MYLICON) chewable tablet 80 mg  80 mg Oral PRN Vena Austria, MD   80 mg at 07/12/15 0335    Labs:   Recent Labs Lab 07/09/15 0910 07/10/15 0448  WBC 11.6* 10.9  HGB 11.6* 9.8*  HCT 35.8 29.9*  PLT 260 199   No results for input(s): NA, K, CL, CO2, BUN, CREATININE, LABGLOM, GLUCOSE, CALCIUM in the last 168 hours.  Assessment & Plan:  Pt doing well *Postpartum/postop: routine care *Dispo: home today  A POS / Rubella Immune / Varicella Immune/  RPR negative / HIV negative / HepBsAg negative / Tdap UTD: Yes/pap unknown / Breast  / Contraception: undecided / Follow up: Johnsie Brock. MD [facility] OBGYN Pager [phone]

## 2015-10-18 ENCOUNTER — Ambulatory Visit (INDEPENDENT_AMBULATORY_CARE_PROVIDER_SITE_OTHER): Payer: 59 | Admitting: Family Medicine

## 2015-10-18 ENCOUNTER — Encounter: Payer: Self-pay | Admitting: Family Medicine

## 2015-10-18 VITALS — BP 118/84 | HR 81 | Temp 98.6°F | Ht 67.0 in | Wt 247.4 lb

## 2015-10-18 DIAGNOSIS — F419 Anxiety disorder, unspecified: Secondary | ICD-10-CM | POA: Diagnosis not present

## 2015-10-18 DIAGNOSIS — E669 Obesity, unspecified: Secondary | ICD-10-CM | POA: Diagnosis not present

## 2015-10-18 DIAGNOSIS — R002 Palpitations: Secondary | ICD-10-CM | POA: Diagnosis not present

## 2015-10-18 DIAGNOSIS — R1011 Right upper quadrant pain: Secondary | ICD-10-CM

## 2015-10-18 DIAGNOSIS — G8929 Other chronic pain: Secondary | ICD-10-CM | POA: Insufficient documentation

## 2015-10-18 LAB — COMPREHENSIVE METABOLIC PANEL
ALK PHOS: 70 U/L (ref 39–117)
ALT: 18 U/L (ref 0–35)
AST: 18 U/L (ref 0–37)
Albumin: 4.3 g/dL (ref 3.5–5.2)
BILIRUBIN TOTAL: 0.5 mg/dL (ref 0.2–1.2)
BUN: 14 mg/dL (ref 6–23)
CALCIUM: 9.4 mg/dL (ref 8.4–10.5)
CO2: 28 mEq/L (ref 19–32)
CREATININE: 0.83 mg/dL (ref 0.40–1.20)
Chloride: 104 mEq/L (ref 96–112)
GFR: 84.5 mL/min (ref >60.00)
GLUCOSE: 89 mg/dL (ref 70–99)
Potassium: 4.1 mEq/L (ref 3.5–5.1)
Sodium: 139 mEq/L (ref 135–145)
TOTAL PROTEIN: 7.7 g/dL (ref 6.0–8.3)

## 2015-10-18 LAB — CBC
HEMATOCRIT: 40.5 % (ref 36.0–46.0)
Hemoglobin: 13.2 g/dL (ref 12.0–15.0)
MCHC: 32.6 g/dL (ref 30.0–36.0)
MCV: 86.5 fl (ref 78.0–100.0)
Platelets: 364 10*3/uL (ref 150.0–400.0)
RBC: 4.68 Mil/uL (ref 3.87–5.11)
RDW: 15.9 % — AB (ref 11.5–15.5)
WBC: 8 10*3/uL (ref 4.0–10.5)

## 2015-10-18 LAB — POCT URINE PREGNANCY: PREG TEST UR: NEGATIVE

## 2015-10-18 LAB — TSH: TSH: 3.47 u[IU]/mL (ref 0.35–4.50)

## 2015-10-18 LAB — HEMOGLOBIN A1C: HEMOGLOBIN A1C: 5.3 % (ref 4.6–6.5)

## 2015-10-18 LAB — LIPASE: LIPASE: 35 U/L (ref 11.0–59.0)

## 2015-10-18 NOTE — Assessment & Plan Note (Addendum)
Intermittent palpitations over the last year. Possibly mild difficulty getting a deep breath and when this occurs. No cardiac history. Does report a family history of thyroid dysfunction. Could be related to thyroid issue. Could be anxiety related as well. Asymptomatic at this time. EKG was reassuring. Doubt cardiac cause. Suspect most likely related to anxiety. We'll check TSH, CBC, and CMP. We'll continue to monitor. Given return precautions.

## 2015-10-18 NOTE — Progress Notes (Signed)
Pre visit review using our clinic review tool, if applicable. No additional management support is needed unless otherwise documented below in the visit note. 

## 2015-10-18 NOTE — Assessment & Plan Note (Addendum)
Chronic intermittent right upper quadrant pain that seems consistent with possible gallbladder pathology. Benign abdominal exam today. Could also be related to gastritis, reflux, or pancreatic issue. Will obtain ultrasound of her right upper quadrant. Discussed Tums for reflux. If ultrasound is negative we'll consider PPI for reflux. Check a CMP and lipase. Urine pregnancy tests negative. UA was checked inadvertently by the lab that revealed large blood and trace leukocytes. Negative nitrites. Patient denies urinary symptoms. Suspect blood and trace leukocytes are related to her being on her period. Otherwise UA was negative. Advised if she developed urinary symptoms she should let us know. Given return precautions.

## 2015-10-18 NOTE — Assessment & Plan Note (Signed)
GAD 7 score of 7. Is followed by a counselor. Declined medication for this. We'll continue to monitor. Given return precautions.

## 2015-10-18 NOTE — Patient Instructions (Signed)
Nice to meet you. We will check lab work today. We will also order an ultrasound of your abdomen to evaluate her gallbladder. If you develop persistent palpitations or shortness of breath, or develop chest pain, fevers, abdominal pain, or any new or changing symptoms please seek medical attention.

## 2015-10-18 NOTE — Progress Notes (Addendum)
Patient ID: Dana Brock, female   DOB: 1981/03/05, 35 y.o.   MRN: [id number]  Tommi Rumps, MD Phone: [phone]  Dana Brock is a 35 y.o. female who presents today for new patient visit  Patient notes periodic palpitations for about the last year. Notes it feels just like a flutter that last for a few seconds. Occasionally she will have a little bit of shortness of breath with this that she describes as being unable to quite take a deep breath. No chest pain with this. All this lasts only a few seconds. It is not exertional. She does note a history of anxiety and sees a Social worker. History of panic attacks in the past. She additionally notes some reflux that was worse when she was pregnant though not as bad now. Typically takes Tums for this. Is worse with fried food or [place] food. She does note intermittent right upper quadrant discomfort for the last several years. Notes that radiates to her right side. Can occur after eating fried foods. She notes no urinary complaints. She is on her period right now. She does note a family history of thyroid dysfunction. She notes mildly dry skin and has a history of eczema. No hair changes. She does note difficulty losing weight. She denies palpitations, shortness of breath, chest pain, abdominal pain, and anxiety at this time.  Active Ambulatory Problems    Diagnosis Date Noted  . Labor and delivery, indication for care 07/09/2015  . Palpitations 10/18/2015  . Abdominal pain, right upper quadrant 10/18/2015  . Anxiety 10/18/2015   Resolved Ambulatory Problems    Diagnosis Date Noted  . No Resolved Ambulatory Problems   Past Medical History  Diagnosis Date  . Obesity   . Chickenpox     Family History  Problem Relation Age of Onset  . Cancer - Other Mother   . Breast cancer      Grandmother  . Prostate cancer      Grandfather  . Hypertension      Parent  . ALS      Grandparent    Social History   Social History  .  Marital Status: Single    Spouse Name: N/A  . Number of Children: N/A  . Years of Education: N/A   Occupational History  . Not on file.   Social History Main Topics  . Smoking status: Former Research scientist (life sciences)  . Smokeless tobacco: Not on file  . Alcohol Use: 0.6 oz/week    1 Standard drinks or equivalent per week  . Drug Use: No  . Sexual Activity:    Partners: Male   Other Topics Concern  . Not on file   Social History Narrative    ROS   General:  Negative for nexplained weight loss, fever Skin: Negative for new or changing mole, sore that won't heal HEENT: Negative for trouble hearing, trouble seeing, ringing in ears, mouth sores, hoarseness, change in voice, dysphagia. CV:  Positive for dyspnea, palpitations, Negative for chest pain, edema Resp: Negative for cough, dyspnea, hemoptysis GI: Negative for nausea, vomiting, diarrhea, constipation, abdominal pain, melena, hematochezia. GU: Negative for dysuria, incontinence, urinary hesitance, hematuria, vaginal or penile discharge, polyuria, sexual difficulty, lumps in testicle or breasts MSK: Negative for muscle cramps or aches, joint pain or swelling Neuro: Negative for headaches, weakness, numbness, dizziness, passing out/fainting Psych: Positive anxiety, Negative for depression, memory problems  Objective  Physical Exam Filed Vitals:   10/18/15 0935  BP: 118/84  Pulse: 81  Temp: 98.6  F (37 C)    BP Readings from Last 3 Encounters:  10/18/15 118/84  07/12/15 123/87   Wt Readings from Last 3 Encounters:  10/18/15 247 lb 6.4 oz (112.22 kg)  07/09/15 261 lb (118.389 kg)    Physical Exam  Constitutional: She is well-developed, well-nourished, and in no distress.  HENT:  Head: Normocephalic and atraumatic.  Right Ear: External ear normal.  Left Ear: External ear normal.  Mouth/Throat: Oropharynx is clear and moist. No oropharyngeal exudate.  Eyes: Conjunctivae are normal. Pupils are equal, round, and reactive to  light.  Neck: Neck supple. No thyromegaly present.  Cardiovascular: Normal rate, regular rhythm and normal heart sounds.  Exam reveals no gallop and no friction rub.   No murmur heard. Pulmonary/Chest: Effort normal and breath sounds normal. No respiratory distress. She has no wheezes. She has no rales.  Abdominal: Soft. Bowel sounds are normal. She exhibits no distension and no mass. There is no tenderness. There is no rebound and no guarding.  Musculoskeletal: She exhibits no edema.  No lower leg tenderness or swelling  Lymphadenopathy:    She has no cervical adenopathy.  Neurological: She is alert. Gait normal.  Skin: Skin is warm and dry. She is not diaphoretic.  Psychiatric: Mood and affect normal.   EKG: Normal sinus rhythm, rate 76, no ST or T-wave changes, within normal limits  Assessment/Plan:   Palpitations Intermittent palpitations over the last year. Possibly mild difficulty getting a deep breath and when this occurs. No cardiac history. Does report a family history of thyroid dysfunction. Could be related to thyroid issue. Could be anxiety related as well. Asymptomatic at this time. EKG was reassuring. Doubt cardiac cause. Suspect most likely related to anxiety. We'll check TSH, CBC, and CMP. We'll continue to monitor. Given return precautions.  Abdominal pain, right upper quadrant Chronic intermittent right upper quadrant pain that seems consistent with possible gallbladder pathology. Benign abdominal exam today. Could also be related to gastritis, reflux, or pancreatic issue. Will obtain ultrasound of her right upper quadrant. Discussed Tums for reflux. If ultrasound is negative we'll consider PPI for reflux. Check a CMP and lipase. Urine pregnancy tests negative. UA was checked inadvertently by the lab that revealed large blood and trace leukocytes. Negative nitrites. Patient denies urinary symptoms. Suspect blood and trace leukocytes are related to her being on her period.  Otherwise UA was negative. Advised if she developed urinary symptoms she should let us know. Given return precautions.  Anxiety GAD 7 score of 7. Is followed by a counselor. Declined medication for this. We'll continue to monitor. Given return precautions.    Orders Placed This Encounter  Procedures  . US Abdomen Limited RUQ    Standing Status: Future     Number of Occurrences:      Standing Expiration Date: 12/15/2016    Order Specific Question:  Reason for Exam (SYMPTOM  OR DIAGNOSIS REQUIRED)    Answer:  RUQ discomfort, intermittent    Order Specific Question:  Preferred imaging location?    Answer:  [facility]  . Comp Met (CMET)  . TSH  . CBC  . HgB A1c  . Lipase  . POCT urine pregnancy  . EKG 12-Lead     Tommi Rumps

## 2015-10-21 ENCOUNTER — Encounter: Payer: Self-pay | Admitting: Family Medicine

## 2015-10-23 ENCOUNTER — Ambulatory Visit
Admission: RE | Admit: 2015-10-23 | Discharge: 2015-10-23 | Disposition: A | Discharge status: Home / Self Care | Payer: 59 | Source: Ambulatory Visit | Attending: Family Medicine | Admitting: Family Medicine

## 2015-10-23 DIAGNOSIS — R1011 Right upper quadrant pain: Secondary | ICD-10-CM | POA: Diagnosis not present

## 2015-10-23 DIAGNOSIS — R1013 Epigastric pain: Secondary | ICD-10-CM | POA: Insufficient documentation

## 2015-11-18 ENCOUNTER — Encounter: Payer: Self-pay | Admitting: Family Medicine

## 2015-11-18 ENCOUNTER — Ambulatory Visit (INDEPENDENT_AMBULATORY_CARE_PROVIDER_SITE_OTHER): Payer: 59 | Admitting: Family Medicine

## 2015-11-18 VITALS — BP 126/78 | HR 76 | Temp 98.1°F | Ht 67.0 in | Wt 251.1 lb

## 2015-11-18 DIAGNOSIS — S29011A Strain of muscle and tendon of front wall of thorax, initial encounter: Secondary | ICD-10-CM

## 2015-11-18 DIAGNOSIS — R002 Palpitations: Secondary | ICD-10-CM | POA: Diagnosis not present

## 2015-11-18 DIAGNOSIS — M549 Dorsalgia, unspecified: Secondary | ICD-10-CM | POA: Insufficient documentation

## 2015-11-18 DIAGNOSIS — F419 Anxiety disorder, unspecified: Secondary | ICD-10-CM

## 2015-11-18 DIAGNOSIS — K760 Fatty (change of) liver, not elsewhere classified: Secondary | ICD-10-CM

## 2015-11-18 DIAGNOSIS — M546 Pain in thoracic spine: Secondary | ICD-10-CM

## 2015-11-18 NOTE — Assessment & Plan Note (Signed)
Discomfort in left axilla area and exam most consistent with pectoral strain. Suspect likely related to carrying car seat in that arm exclusively. No chest pain to indicate cardiac cause or PE. No risk factors for cardiac or PE cause. Vital signs are stable. Discussed resting her left arm and pectoral muscle. Discussed as needed ibuprofen or Tylenol. She will continue to monitor. She is given return precautions.

## 2015-11-18 NOTE — Progress Notes (Signed)
Patient ID: Dana Brock, female   DOB: 11/15/1980, 35 y.o.   MRN: [id number]  Marikay AlarEric Reeanna Acri, MD Phone: [phone]  Dana Brock is a 35 y.o. female who presents today for  Follow-up.   anxiety: Notes this is well controlled at this time. Occurs intermittently. No depression, SI, or HI. See's a therapist for this issue.   Right-sided pain: This is improved. Notes it is more right mid back pain now. Comes and goes. Is a dull achy pain. Notes there are muscles that are tender and hurt when twisting. No abdominal pain at this time. Note she had a massage and the discomfort has improved. No numbness, weakness, bowel or bladder incontinence, saddle anesthesia, fevers, or history of cancer. She did have right upper quadrant ultrasound that did not reveal any gallstones though did reveal mild fatty liver.  Left armpit pain: Patient notes for the last month or so she's had intermittent discomfort near her left armpit. She notes it is a sharp discomfort intermittently. Improves with stretching. It is not exertional. No chest pain or shortness of breath. No history of blood clot, unilateral leg swelling, recent surgeries, or recent long trips. No specific injuries to the area. Notes she has been carrying her child's car seat in the left arm and this may have brought this about. No breast pain. Occasionally radiates to the inner aspect of her upper arm  Palpitations: Patient notes these are significantly improved. Still gets them intermittently. They occur when she is tired, hungry, or thirsty. She does note family history of thyroid dysfunction and notes that her family has had normal TSHs and abnormal T3 and T4. She would like to have these checked today. Prior workup is unrevealing for cause.  PMH: Former smoker.   ROS see history of present illness  Objective  Physical Exam Filed Vitals:   11/18/15 1509  BP: 126/78  Pulse: 76  Temp: 98.1 F (36.7 C)    BP Readings from Last 3  Encounters:  11/18/15 126/78  10/18/15 118/84  07/12/15 123/87   Wt Readings from Last 3 Encounters:  11/18/15 251 lb 2 oz (113.91 kg)  10/18/15 247 lb 6.4 oz (112.22 kg)  07/09/15 261 lb (118.389 kg)    Physical Exam  Constitutional: She is well-developed, well-nourished, and in no distress.  HENT:  Head: Normocephalic and atraumatic.  Right Ear: External ear normal.  Left Ear: External ear normal.  Mouth/Throat: Oropharynx is clear and moist. No oropharyngeal exudate.  Eyes: Conjunctivae are normal. Pupils are equal, round, and reactive to light.  Cardiovascular: Normal rate, regular rhythm and normal heart sounds.  Exam reveals no gallop and no friction rub.   No murmur heard. Pulmonary/Chest: Effort normal and breath sounds normal. No respiratory distress. She has no wheezes. She has no rales.  Abdominal: Soft. Bowel sounds are normal. She exhibits no distension. There is no tenderness. There is no rebound and no guarding.  Musculoskeletal: She exhibits no edema.  No midline spine tenderness, no midline spine step-off, mild right mid back muscular tenderness, no spasm or swelling in this area Tenderness palpation over left pectoral muscle near the insertion into axilla, no other pectoral muscle tenderness, no tenderness in her axilla or arm, full range of motion of shoulders, patient opted to stay in her tank top and bra though had no tenderness of her bilateral lateral breasts  Neurological: She is alert.  5 out of 5 strength bilateral quads, hamstrings, plantar flexion, and dorsiflexion, triceps, biceps, and grip  sensation light touch intact bilaterally upper and lower extremities, 2+ patellar reflexes  Skin: Skin is warm and dry. She is not diaphoretic.     Assessment/Plan: Please see individual problem list.  Anxiety Stable. Continue to follow with therapist. Given return precautions.  Palpitations Intermittent over the last year. Have improved since our last visit.  Asymptomatic at this time. Prior workup was unrevealing. We will check a free T3 and free T4 to complete the workup. Discussed if this continues to occur would consider cardiology evaluation. In return precautions.  Strain of left pectoralis muscle Discomfort in left axilla area and exam most consistent with pectoral strain. Suspect likely related to carrying car seat in that arm exclusively. No chest pain to indicate cardiac cause or PE. No risk factors for cardiac or PE cause. Vital signs are stable. Discussed resting her left arm and pectoral muscle. Discussed as needed ibuprofen or Tylenol. She will continue to monitor. She is given return precautions.  Back pain Suspect right-sided discomfort is related muscle strain in her back. Exam consistent with muscle strain. Neurologically intact. Discussed weight loss. Stay active to help with pain. Ibuprofen or Tylenol as needed. She'll continue to monitor. She is given return precautions.    Orders Placed This Encounter  Procedures  . T3, free  . T4, free  . Lipid Profile   Patient with minimal fatty liver noted on recent ultrasound. We'll check lipid panel. Patient reports having had hepatitis B vaccinations in the past.  Marikay Alar, MD [facility]

## 2015-11-18 NOTE — Assessment & Plan Note (Signed)
Stable. Continue to follow with therapist. Given return precautions.

## 2015-11-18 NOTE — Progress Notes (Signed)
Pre visit review using our clinic review tool, if applicable. No additional management support is needed unless otherwise documented below in the visit note. 

## 2015-11-18 NOTE — Patient Instructions (Addendum)
Nice to see you. We will check your thyroid hormones to ensure that they are normal. You can take ibuprofen 600 mg every 8 hours as needed for discomfort. If you develop abdominal pain, chest pain, shortness breath, palpitations, depression, worsening anxiety, thoughts of harming herself or others, or any new or changing symptoms please seek medical attention.

## 2015-11-18 NOTE — Assessment & Plan Note (Signed)
Intermittent over the last year. Have improved since our last visit. Asymptomatic at this time. Prior workup was unrevealing. We will check a free T3 and free T4 to complete the workup. Discussed if this continues to occur would consider cardiology evaluation. In return precautions.

## 2015-11-18 NOTE — Assessment & Plan Note (Signed)
Suspect right-sided discomfort is related muscle strain in her back. Exam consistent with muscle strain. Neurologically intact. Discussed weight loss. Stay active to help with pain. Ibuprofen or Tylenol as needed. She'll continue to monitor. She is given return precautions.

## 2015-11-19 LAB — LIPID PANEL
CHOLESTEROL: 173 mg/dL (ref 0–200)
HDL: 37.1 mg/dL — AB (ref >39.00)
LDL CALC: 101 mg/dL — AB (ref 0–99)
NonHDL: 135.69
Total CHOL/HDL Ratio: 5
Triglycerides: 174 mg/dL — ABNORMAL HIGH (ref 0.0–149.0)
VLDL: 34.8 mg/dL (ref 0.0–40.0)

## 2015-11-19 LAB — T4, FREE: FREE T4: 0.69 ng/dL (ref 0.60–1.60)

## 2015-11-19 LAB — T3, FREE: T3, Free: 3.1 pg/mL (ref 2.3–4.2)

## 2016-02-24 ENCOUNTER — Ambulatory Visit (INDEPENDENT_AMBULATORY_CARE_PROVIDER_SITE_OTHER): Payer: 59 | Admitting: Family Medicine

## 2016-02-24 ENCOUNTER — Encounter: Payer: Self-pay | Admitting: Family Medicine

## 2016-02-24 VITALS — BP 106/68 | HR 63 | Temp 98.0°F | Ht 67.0 in | Wt 249.8 lb

## 2016-02-24 DIAGNOSIS — E669 Obesity, unspecified: Secondary | ICD-10-CM | POA: Diagnosis not present

## 2016-02-24 DIAGNOSIS — M546 Pain in thoracic spine: Secondary | ICD-10-CM | POA: Diagnosis not present

## 2016-02-24 DIAGNOSIS — K760 Fatty (change of) liver, not elsewhere classified: Secondary | ICD-10-CM | POA: Diagnosis not present

## 2016-02-24 DIAGNOSIS — F419 Anxiety disorder, unspecified: Secondary | ICD-10-CM

## 2016-02-24 DIAGNOSIS — R002 Palpitations: Secondary | ICD-10-CM

## 2016-02-24 NOTE — Assessment & Plan Note (Signed)
Most likely related to muscular strain. Improving with physical activity and less of an issue now. Neurologically intact in her lower extremities. No red flags. She'll continue exercise. Continue to monitor. Given return precautions.

## 2016-02-24 NOTE — Patient Instructions (Signed)
Nice to see you. Congratulations on your diet and exercise. We will refer you to a nutritionist for additional help. We will check some hepatitis labs to ensure that you're immune. If you're not immune we will discuss given the vaccines given her fatty liver. Please continue to exercise and monitor her back discomfort. If this worsens or changes please let us know. If you develop worsened back pain, numbness, weakness, loss of bowel or bladder function, numbness of her legs, or any new or changing symptoms please seek medical attention.

## 2016-02-24 NOTE — Assessment & Plan Note (Signed)
Has not recurred. We'll continue to monitor.

## 2016-02-24 NOTE — Assessment & Plan Note (Signed)
Stable continue to see therapist

## 2016-02-24 NOTE — Assessment & Plan Note (Signed)
Abdomen prior ultrasound of right upper quadrant. Mild. Check hepatitis serologies to determine need for vaccinations.

## 2016-02-24 NOTE — Progress Notes (Signed)
Pre visit review using our clinic review tool, if applicable. No additional management support is needed unless otherwise documented below in the visit note. 

## 2016-02-24 NOTE — Assessment & Plan Note (Signed)
BMI of 39. Discussed diet and exercise. Congratulated her on losing 3 inches around her hips. Discussed that some of her inability to drop weight may be due to weight gain from her working out. We'll refer to nutritionist to discuss other options.

## 2016-02-24 NOTE — Progress Notes (Signed)
Patient ID: Dana Brock, female   DOB: Mar 30, 1981, 35 y.o.   MRN: [id number]  Marikay Alar, MD Phone: [phone]  Dana Brock is a 35 y.o. female who presents today for follow-up.  Anxiety: Patient notes this is stable and not a significant issue at this time. Continue see therapist. No depression. Not currently on any medications.  Palpitations: Have not recurred since her last visit.  Obesity: Patient notes she has started to eat healthier. Is meal planning. Eating mostly grilled chicken and vegetables. Has been doing [employer] that included cardio and weights. Has been doing this for about 6 weeks. Typically does this daily. Note she has not lost any weight though has lost 3 inches around her hips.  Fatty liver: She notes no right upper quadrant discomfort. Thinks she received hepatitis B vaccines when she was younger. Unsure about hepatitis A.  Right mid back pain: Patient notes this has improved some with her physical activity. Notes it occurs in her right mid to low back. Mostly after getting out of bed when she has stiffened up. It improved with movement. Occasional radiate down to her hips. No numbness, weakness, loss of bowel or bladder function, saddle anesthesia, fevers, or history of cancer.  PMH: Former smoker   ROS see history of present illness  Objective  Physical Exam Filed Vitals:   02/24/16 1355  BP: 106/68  Pulse: 63  Temp: 98 F (36.7 C)    BP Readings from Last 3 Encounters:  02/24/16 106/68  11/18/15 126/78  10/18/15 118/84   Wt Readings from Last 3 Encounters:  02/24/16 249 lb 12.8 oz (113.309 kg)  11/18/15 251 lb 2 oz (113.91 kg)  10/18/15 247 lb 6.4 oz (112.22 kg)    Physical Exam  Constitutional: She is well-developed, well-nourished, and in no distress.  HENT:  Head: Normocephalic and atraumatic.  Right Ear: External ear normal.  Left Ear: External ear normal.  Cardiovascular: Normal rate, regular rhythm and  normal heart sounds.   Pulmonary/Chest: Effort normal and breath sounds normal.  Abdominal: Soft. Bowel sounds are normal. She exhibits no distension. There is no tenderness. There is no rebound and no guarding.  Musculoskeletal:  No midline spine tenderness, no midline spine step-off, right paraspinous thoracic and lumbar spine tenderness, no overlying skin changes  Neurological: She is alert.  5 out of 5 strength bilateral quads, hamstrings, plantar flexion, and dorsiflexion, sensation light touch intact bilaterally lower extremities, absent patellar reflexes  Skin: Skin is warm and dry. She is not diaphoretic.     Assessment/Plan: Please see individual problem list.  Anxiety Stable continue to see therapist  Back pain Most likely related to muscular strain. Improving with physical activity and less of an issue now. Neurologically intact in her lower extremities. No red flags. She'll continue exercise. Continue to monitor. Given return precautions.  Palpitations Has not recurred. We'll continue to monitor.  Fatty liver Abdomen prior ultrasound of right upper quadrant. Mild. Check hepatitis serologies to determine need for vaccinations.  Obesity BMI of 39. Discussed diet and exercise. Congratulated her on losing 3 inches around her hips. Discussed that some of her inability to drop weight may be due to weight gain from her working out. We'll refer to nutritionist to discuss other options.    Orders Placed This Encounter  Procedures  . Hepatitis A Ab, Total  . Hepatitis B Surface AntiBODY  . Hepatitis B Core Antibody, total  . Hepatitis B Surface AntiGEN  . Amb ref  to Medical Nutrition Therapy-MNT    Referral Priority:  Routine    Referral Type:  Consultation    Referral Reason:  Specialty Services Required    Requested Specialty:  Nutrition    Number of Visits Requested:  1    Marikay AlarEric Sonnenberg, MD [facility] [place]

## 2016-02-25 LAB — HEPATITIS B CORE ANTIBODY, TOTAL: HEP B C TOTAL AB: NONREACTIVE

## 2016-02-25 LAB — HEPATITIS B SURFACE ANTIGEN: Hepatitis B Surface Ag: NEGATIVE

## 2016-02-25 LAB — HEPATITIS A ANTIBODY, TOTAL: HEP A TOTAL AB: NONREACTIVE

## 2016-02-25 LAB — HEPATITIS B SURFACE ANTIBODY,QUALITATIVE: HEP B S AB: POSITIVE — AB

## 2016-03-31 DIAGNOSIS — M722 Plantar fascial fibromatosis: Secondary | ICD-10-CM | POA: Diagnosis not present

## 2016-03-31 DIAGNOSIS — M79671 Pain in right foot: Secondary | ICD-10-CM | POA: Diagnosis not present

## 2016-04-03 ENCOUNTER — Encounter: Payer: 59 | Attending: Family Medicine | Admitting: Dietician

## 2016-04-03 ENCOUNTER — Encounter: Payer: Self-pay | Admitting: Dietician

## 2016-04-03 VITALS — Ht 67.0 in | Wt 248.2 lb

## 2016-04-03 DIAGNOSIS — E669 Obesity, unspecified: Secondary | ICD-10-CM | POA: Diagnosis not present

## 2016-04-03 NOTE — Patient Instructions (Signed)
   Eat something every 3-5 hours as much as you are able. Avoid getting overly hungry (which leads to overeating).   Choose healthy snack foods.   Plan ahead for balanced meals (lean protein, controlled starch portion, and a vegetable or fruit, or both). A sandwich with whole grain bread, lean meat/egg/pnut butter for protein, and lettuce and tomato, or carrot sticks, and a fruit is a balanced meal.   Some form of tracking food intake or progress on goals each day will help with ongoing weight loss.   Keep up your regular exercise, great job!

## 2016-04-03 NOTE — Progress Notes (Signed)
Medical Nutrition Therapy: Visit start time: 1315  end time: 1415  Assessment:  Diagnosis: obesity Past medical history: none significant  Psychosocial issues/ stress concerns: patient reports moderate stress level, does some stress eating Preferred learning method:  . Auditory . Hands-on  Current weight: 248.2lbs  Height: 5'7" Medications, supplements: no medications at this time  Progress and evaluation: Patient reports having dieted in the past with only short-term success.          She is finding weight loss more difficult since the birth of a baby last fall, and seeks help with healthy and sustainable eating plan.           Physical activity: walking 30 minutes, 4-5 days per week. Was doing exercise video until having heel pain.  Dietary Intake:  Usual eating pattern includes 2-3 meals and 1-2 snacks per day. Dining out frequency: 6-8 meals per week.  Breakfast: 7-8am when working, 10:30-11 when off: 2sl bacon, 2 boiled eggs, avocado; scrambled eggs with [place] or reg. Tomasa BlaseBacon, toast. Sometimes pancakes or french toast or egg sandwich on weekends. Used to eat oatmeal.  Snack: sometimes at work: banana or granola bar Lunch: 11:30 on workdays: [employer] cafeteria, or sandwich, salad, leftovers. When off: takeout food; or skips.  Snack: not consistent Supper: chicken in crock pot with vegetables, beans or rice; pork tenderloin; weekends often out; grilling burgers, hot dogs.  Snack: none on workdays; weekends sometimes chips or popcorn; occasionally ice cream, nothing consistent Beverages: water mostly. No sodas except for rare occasions, stopped sweet tea years ago. 1 c coffee in am.   Nutrition Care Education: Topics covered: weight management Basic nutrition: basic food groups, appropriate nutrient balance, appropriate meal and snack schedule, general nutrition guidelines    Weight control: determining reasonable weight goal, guidance for 1600kcal daily intake with 40% CHO, 30% protein,  30% fat; importance of lowfat and low sugar foods; portion control, basic meal planning; tracking food intake and weighing regularly; healthy meal and snack options.   Nutritional Diagnosis:  [place]-3.3 Overweight/obesity As related to history of frequent dieting, excess calories.  As evidenced by patient report.  Intervention: Instruction as noted above.   Patient will work on improving nutrient balance in her meals and snacks, some consistency in eating pattern (difficult due to work schedule).    Set goals with patient input.  Education Materials given:  . Food lists/ Planning A Balanced Meal . Sample meal pattern/ menus: Quick and Healthy Meal Ideas . Snacking handout . Goals/ instructions  Learner/ who was taught:  . Patient   Level of understanding: Marland Kitchen. Verbalizes/ demonstrates competency  Demonstrated degree of understanding via:   Teach back Learning barriers: . None  Willingness to learn/ readiness for change: . Acceptance, ready for change   Monitoring and Evaluation:  Dietary intake, exercise, and body weight      follow up: 05/01/16

## 2016-04-27 ENCOUNTER — Ambulatory Visit: Payer: 59 | Admitting: Family Medicine

## 2016-05-01 ENCOUNTER — Encounter: Payer: 59 | Attending: Family Medicine | Admitting: Dietician

## 2016-05-01 VITALS — Ht 67.0 in | Wt 247.0 lb

## 2016-05-01 DIAGNOSIS — E669 Obesity, unspecified: Secondary | ICD-10-CM | POA: Insufficient documentation

## 2016-05-01 NOTE — Progress Notes (Signed)
Medical Nutrition Therapy: Visit start time: 8016  end time: 5537  Assessment:  Diagnosis: obesity Medical history changes: no changes Psychosocial issues/ stress concerns: none  Current weight: 247.0lbs  Height: 5'7" Medications, supplement changes: no changes  Progress and evaluation: Patient reports ongoing struggle to lose weight, despite making positive diet and lifestyle changes.  She has met goals set at initial visit, other than tracking food intake. She reports 1 weekend vacation, but otherwise has adhered to planned, balanced meals. Some meals could still be low in carbohydrate.    Physical activity: walking 30 minutes, 4-5 times per week  Dietary Intake:  Usual eating pattern includes 3 meals and 1-2 snacks per day. Dining out frequency: not assessed today.  Breakfast: 2 scrambled eggs with [place] sausage, tomato, mushrooms Snack: maybe fruit Lunch: salad with baked chicken Snack: occasionally peanut butter crackers Supper: recently [place], cabbage Snack: usually none Beverages: water, coffee  Nutrition Care Education: Topics covered: weight management Basic nutrition: appropriate nutrient balance    Weight control: dealing with weight loss plateaus, low metabolism; reviewed benefits of tracking food intake, regular weighing   Nutritional Diagnosis:  [place]-3.3 Overweight/obesity As related to history of frequent dieting, excess calories.  As evidenced by patient report.  Intervention: Discussion as noted above.   Set new goals with patient direction.    She will be participating in a weight loss challenge at work for the next 6 weeks.   Education Materials given:  Marland Kitchen Goals/ instructions  Learner/ who was taught:  . Patient   Level of understanding: Marland Kitchen Verbalizes/ demonstrates competency   Demonstrated degree of understanding via:   Teach back Learning barriers: . None  Willingness to learn/ readiness for change: . Eager, change in  progress  Monitoring and Evaluation:  Dietary intake, exercise, and body weight      follow up: 06/01/16

## 2016-05-01 NOTE — Patient Instructions (Signed)
   Continue with regular exercise, add some strength training exercises during the day to increase muscle weight.   Track food intake to monitor calories, try MyFitnessPal, or LoseIt which are free. Keep to 1200 calories at a minimum, up to the 1600 recommended.

## 2016-05-11 ENCOUNTER — Encounter: Payer: Self-pay | Admitting: Family Medicine

## 2016-05-11 ENCOUNTER — Ambulatory Visit (INDEPENDENT_AMBULATORY_CARE_PROVIDER_SITE_OTHER): Payer: 59 | Admitting: Family Medicine

## 2016-05-11 VITALS — BP 112/70 | HR 83 | Temp 97.9°F | Wt 243.0 lb

## 2016-05-11 DIAGNOSIS — M722 Plantar fascial fibromatosis: Secondary | ICD-10-CM | POA: Diagnosis not present

## 2016-05-11 DIAGNOSIS — R21 Rash and other nonspecific skin eruption: Secondary | ICD-10-CM | POA: Diagnosis not present

## 2016-05-11 DIAGNOSIS — L309 Dermatitis, unspecified: Secondary | ICD-10-CM

## 2016-05-11 DIAGNOSIS — M546 Pain in thoracic spine: Secondary | ICD-10-CM | POA: Diagnosis not present

## 2016-05-11 DIAGNOSIS — R0789 Other chest pain: Secondary | ICD-10-CM | POA: Diagnosis not present

## 2016-05-11 DIAGNOSIS — E669 Obesity, unspecified: Secondary | ICD-10-CM

## 2016-05-11 NOTE — Assessment & Plan Note (Signed)
Patient's history and exam most consistent with musculoskeletal chest pain. Suspect possibly costochondritis versus muscular strain. Doubt cardiac cause or VTE given lack of history and lack of risk factors and stable vital signs. Discussed anti-inflammatory use with ibuprofen over-the-counter. She'll continue to monitor. Given return precautions.

## 2016-05-11 NOTE — Progress Notes (Signed)
  Dana AlarEric Kaori Jumper, MD Phone: [phone]  Dana Brock is a 35 y.o. female who presents today for follow-up.  Obesity: Has seen a nutritionist and has started eating more protein and vegetables. Not exercising.  Eczema: Patient notes on her right hand she has had significant eczematous rash over the last several months. Was previously treating with some kind of cream though is unsure what medication this was. Was previously followed by dermatology for this. Wants a referral to dermatology.  Musculoskeletal chest discomfort: Patient notes in her upper chest she has had a soreness and achiness especially if she stretches her neck back. She noted this first this morning. Has been sore to touch. No shortness of breath. No radiation. No exertional component. No unilateral leg swelling. No history of diabetes, hypertension, or hyperlipidemia. No family history of cardiac disease. Does note some mild back discomfort as well which has been a chronic intermittent issue. Notes the back discomfort is worse in the morning after waking up and gets better throughout the day. No numbness or weakness or loss of bowel or bladder function or saddle anesthesia.  PMH: Former smoker.   ROS see history of present illness  Objective  Physical Exam Vitals:   05/11/16 1109  BP: 112/70  Pulse: 83  Temp: 97.9 F (36.6 C)    BP Readings from Last 3 Encounters:  05/11/16 112/70  02/24/16 106/68  11/18/15 126/78   Wt Readings from Last 3 Encounters:  05/11/16 243 lb (110.2 kg)  05/01/16 247 lb (112 kg)  04/03/16 248 lb 3.2 oz (112.6 kg)    Physical Exam  Constitutional: No distress.  HENT:  Head: Normocephalic and atraumatic.  Cardiovascular: Normal rate, regular rhythm and normal heart sounds.   Pulmonary/Chest: Effort normal and breath sounds normal. She exhibits tenderness (bilateral costochondral joints tender to palpation).  Musculoskeletal:  No midline spine tenderness, no midline spine  step-off, mild right thoracic muscular tenderness  Neurological: She is alert. Gait normal.  5/5 strength in bilateral biceps, triceps, grip, quads, hamstrings, plantar and dorsiflexion, sensation to light touch intact in bilateral UE and LE, normal gait, 2+ patellar reflexes  Skin: Skin is warm and dry. She is not diaphoretic.  Patient with patch of scaly skin with skin breakdown and cracking on her right lateral dorsal hand     Assessment/Plan: Please see individual problem list.  Back pain Suspect continues to be related to muscular strain. No red flags. Neurologically intact in her upper and lower extremities. Continue exercise. Can use ibuprofen given return precautions.  Musculoskeletal chest pain Patient's history and exam most consistent with musculoskeletal chest pain. Suspect possibly costochondritis versus muscular strain. Doubt cardiac cause or VTE given lack of history and lack of risk factors and stable vital signs. Discussed anti-inflammatory use with ibuprofen over-the-counter. She'll continue to monitor. Given return precautions.  Eczema Referred to dermatology.  Obesity Is down 8 pounds. Encouraged continued weight loss with diet and exercise.   Orders Placed This Encounter  Procedures  . Ambulatory referral to Dermatology    Referral Priority:   Routine    Referral Type:   Consultation    Referral Reason:   Specialty Services Required    Requested Specialty:   Dermatology    Number of Visits Requested:   1    Dana AlarEric Cressida Milford, MD [facility] [place]

## 2016-05-11 NOTE — Assessment & Plan Note (Signed)
Referred to dermatology 

## 2016-05-11 NOTE — Assessment & Plan Note (Signed)
Suspect continues to be related to muscular strain. No red flags. Neurologically intact in her upper and lower extremities. Continue exercise. Can use ibuprofen given return precautions.

## 2016-05-11 NOTE — Progress Notes (Signed)
Pre visit review using our clinic review tool, if applicable. No additional management support is needed unless otherwise documented below in the visit note. 

## 2016-05-11 NOTE — Patient Instructions (Signed)
Nice to see you. You likely have inflammation in your joints and muscles in your chest.  You can take ibuprofen 600 mg every 8 hours as needed. Take this with food. Please continue to work on diet and exercise. We'll refer you to dermatology. If you have any change in your chest pain or develop shortness of breath, sweating, or any new or changing symptoms please seek medical attention.

## 2016-05-11 NOTE — Assessment & Plan Note (Signed)
Is down 8 pounds. Encouraged continued weight loss with diet and exercise.

## 2016-05-14 ENCOUNTER — Encounter: Payer: Self-pay | Admitting: Family Medicine

## 2016-06-01 ENCOUNTER — Ambulatory Visit: Payer: 59 | Admitting: Dietician

## 2016-06-05 ENCOUNTER — Telehealth: Payer: Self-pay | Admitting: Dietician

## 2016-06-05 NOTE — Telephone Encounter (Signed)
Called patient to reschedule appointment which was missed on 06/01/16. Left voicemail message for her to call back.

## 2016-06-29 ENCOUNTER — Encounter: Payer: Self-pay | Admitting: Dietician

## 2016-06-29 NOTE — Progress Notes (Signed)
Have not heard back from patient to reschedule missed appointment. Sent discharge letter to MD. 

## 2016-08-10 ENCOUNTER — Ambulatory Visit: Payer: 59 | Admitting: Family Medicine

## 2016-08-28 ENCOUNTER — Ambulatory Visit: Payer: 59 | Admitting: Family Medicine

## 2016-09-16 DIAGNOSIS — G43109 Migraine with aura, not intractable, without status migrainosus: Secondary | ICD-10-CM | POA: Diagnosis not present

## 2016-11-11 ENCOUNTER — Encounter: Payer: Self-pay | Admitting: Physician Assistant

## 2016-11-11 ENCOUNTER — Ambulatory Visit: Payer: Self-pay | Admitting: Physician Assistant

## 2016-11-11 VITALS — BP 120/80 | HR 72 | Temp 98.4°F

## 2016-11-11 DIAGNOSIS — H10022 Other mucopurulent conjunctivitis, left eye: Secondary | ICD-10-CM

## 2016-11-11 MED ORDER — GENTAMICIN SULFATE 0.3 % OP SOLN: 1.0000 [drp] | OPHTHALMIC | 0 refills | Status: DC

## 2016-11-11 NOTE — Progress Notes (Signed)
   Subjective:Pink Eye    Patient ID: Dana Brock, female    DOB: 03/02/1981, 36 y.o.   MRN: [id number]  HPI Patient c/o left eye drainage and redness for 2 days. Awaken this morning with matted left eyelid. No vision loss. Patient do not wear contact lens. Denies URI S/S. No palliative measure for compliant.   Review of Systems    Negative except for compliant. Objective:   Physical Exam  HEENT remarkable for erythematous left conjunctiva. Dry secretion lateral left eye lids. PERRL and EOM intact.       Assessment & Plan:Conjunctivitis  Gentex eye drops and follow up e days if no improvement.

## 2016-11-26 ENCOUNTER — Telehealth: Payer: 59 | Admitting: Physician Assistant

## 2016-11-26 DIAGNOSIS — H109 Unspecified conjunctivitis: Secondary | ICD-10-CM

## 2016-11-26 NOTE — Progress Notes (Signed)
Based on what you shared with me it looks like you have a condition that should be evaluated in a face to face office visit.  You have already been assessed for this issue in office and symptoms are not improving with prescription medication. As such you need reassessment with examination to determine the best alternative treatment.   NOTE: Even if you have entered your credit card information for this eVisit, you will not be charged.   If you are having a true medical emergency please call 911.  If you need an urgent face to face visit, [facility] has four urgent care centers for your convenience.  If you need care fast and have a high deductible or no insurance consider:   [email or web address]  [phone]  [address], [address] [place], [place] [address] 8 am to 8 pm Monday-Friday 10 am to 4 pm Saturday-Sunday   The following sites will take your  insurance:    . [facility]  [phone] Get Driving Directions Find a Provider at this Location  [address] [place], [place] [address] . 10 am to 8 pm Monday-Friday . 12 pm to 8 pm Saturday-Sunday   . [facility]  [phone] Get Driving Directions Find a Provider at this Location  [address] [place], [place] [address] . 8 am to 8 pm Monday-Friday . 9 am to 6 pm Saturday . 11 am to 6 pm Sunday   . [facility] [place]  [phone] Get Driving Directions  [address] [place], [place] [address] . 8 am to 8 pm Monday-Friday . 8 am to 4 pm Saturday-Sunday   Your e-visit answers were reviewed by a board certified advanced clinical practitioner to complete your personal care plan.  Thank you for using e-Visits.

## 2016-11-27 ENCOUNTER — Ambulatory Visit (INDEPENDENT_AMBULATORY_CARE_PROVIDER_SITE_OTHER): Payer: 59 | Admitting: Family Medicine

## 2016-11-27 ENCOUNTER — Other Ambulatory Visit: Payer: Self-pay | Admitting: Family Medicine

## 2016-11-27 ENCOUNTER — Other Ambulatory Visit (INDEPENDENT_AMBULATORY_CARE_PROVIDER_SITE_OTHER): Payer: Self-pay

## 2016-11-27 ENCOUNTER — Encounter: Payer: Self-pay | Admitting: Family Medicine

## 2016-11-27 VITALS — BP 110/80 | HR 92 | Temp 99.0°F | Ht 67.0 in | Wt 246.8 lb

## 2016-11-27 DIAGNOSIS — R946 Abnormal results of thyroid function studies: Secondary | ICD-10-CM | POA: Diagnosis not present

## 2016-11-27 DIAGNOSIS — H1033 Unspecified acute conjunctivitis, bilateral: Secondary | ICD-10-CM | POA: Diagnosis not present

## 2016-11-27 DIAGNOSIS — Z0001 Encounter for general adult medical examination with abnormal findings: Secondary | ICD-10-CM | POA: Diagnosis not present

## 2016-11-27 DIAGNOSIS — R1031 Right lower quadrant pain: Secondary | ICD-10-CM | POA: Diagnosis not present

## 2016-11-27 DIAGNOSIS — H109 Unspecified conjunctivitis: Secondary | ICD-10-CM | POA: Insufficient documentation

## 2016-11-27 DIAGNOSIS — R7989 Other specified abnormal findings of blood chemistry: Secondary | ICD-10-CM

## 2016-11-27 LAB — COMPREHENSIVE METABOLIC PANEL
ALBUMIN: 4.7 g/dL (ref 3.5–5.2)
ALK PHOS: 47 U/L (ref 39–117)
ALT: 12 U/L (ref 0–35)
AST: 13 U/L (ref 0–37)
BUN: 16 mg/dL (ref 6–23)
CALCIUM: 9.6 mg/dL (ref 8.4–10.5)
CO2: 23 mEq/L (ref 19–32)
CREATININE: 0.83 mg/dL (ref 0.40–1.20)
Chloride: 104 mEq/L (ref 96–112)
GFR: 83.92 mL/min (ref >60.00)
Glucose, Bld: 99 mg/dL (ref 70–99)
POTASSIUM: 4 meq/L (ref 3.5–5.1)
Sodium: 134 mEq/L — ABNORMAL LOW (ref 135–145)
TOTAL PROTEIN: 7.9 g/dL (ref 6.0–8.3)
Total Bilirubin: 0.5 mg/dL (ref 0.2–1.2)

## 2016-11-27 LAB — T3, FREE: T3 FREE: 3.4 pg/mL (ref 2.3–4.2)

## 2016-11-27 LAB — TSH: TSH: 7.04 u[IU]/mL — ABNORMAL HIGH (ref 0.35–4.50)

## 2016-11-27 LAB — CBC
HEMATOCRIT: 43.7 % (ref 36.0–46.0)
Hemoglobin: 14.4 g/dL (ref 12.0–15.0)
MCHC: 32.9 g/dL (ref 30.0–36.0)
MCV: 89.5 fl (ref 78.0–100.0)
Platelets: 311 10*3/uL (ref 150.0–400.0)
RBC: 4.88 Mil/uL (ref 3.87–5.11)
RDW: 13.4 % (ref 11.5–15.5)
WBC: 9 10*3/uL (ref 4.0–10.5)

## 2016-11-27 LAB — HEMOGLOBIN A1C: HEMOGLOBIN A1C: 5.6 % (ref 4.6–6.5)

## 2016-11-27 LAB — POCT URINE PREGNANCY: Preg Test, Ur: NEGATIVE

## 2016-11-27 LAB — T4, FREE: FREE T4: 0.77 ng/dL (ref 0.60–1.60)

## 2016-11-27 MED ORDER — POLYMYXIN B-TRIMETHOPRIM 10000-0.1 UNIT/ML-% OP SOLN
1.0000 [drp] | Freq: Four times a day (QID) | OPHTHALMIC | 0 refills | Status: DC
Start: 2016-11-27 — End: 2017-04-23

## 2016-11-27 NOTE — Patient Instructions (Signed)
Nice to see you. Please continue to work on your diet. Please start exercising again. Please get set up for follow-up with your gynecologist. We will try a new eyedrop and if this is not beneficial we need you to see your eye doctor. We will obtain some lab work that will evaluate your abdominal discomfort. It is likely related to constipation and if needed you can try MiraLAX over-the-counter. If you develop abdominal pain, vision changes, eye pain, or any new or changing symptoms please seek medical attention immediately.

## 2016-11-27 NOTE — Assessment & Plan Note (Signed)
Patient with bilateral conjunctivitis initially responding to gentamicin drops. No obvious purulent discharge at this time. We'll cover for bacterial conjunctivitis with Polytrim. Could be allergic or viral in nature. If not improving with Polytrim she will follow-up with her ophthalmologist.

## 2016-11-27 NOTE — Addendum Note (Signed)
Addended by: Glori LuisSONNENBERG, Judiann Celia G on: 11/27/2016 04:20 PM   Modules accepted: Orders

## 2016-11-27 NOTE — Progress Notes (Signed)
Pre visit review using our clinic review tool, if applicable. No additional management support is needed unless otherwise documented below in the visit note. 

## 2016-11-27 NOTE — Assessment & Plan Note (Signed)
Physical exam completed. Discussed diet and exercise. Pelvic and breast exams done through gynecology. She was encouraged to set up a follow-up appointment with them for her yearly exam given her mother's history of uterine cancer. We'll obtain lab work as outlined below. Vaccinations up-to-date.

## 2016-11-27 NOTE — Progress Notes (Signed)
Tommi Rumps, MD Phone: [phone]  Dana Brock is a 36 y.o. female who presents today for physical exam.  She is not exercising. She has changed to a ketogenic diet for the last 2 weeks and is down about 6 pounds. Tetanus vaccination and flu vaccination up-to-date. Pap smear reportedly done in December 2016. Followed by gynecology. She notes regular periods. HIV testing in 2016 with the birth of her daughter. No tobacco use, alcohol use, or illicit drug use.  Notes her mother has a history of uterine cancer in her 51s. Her paternal grandmother had breast cancer. Her paternal grandfather had prostate cancer.  Patient has been dealing with pinkeye for some time now. She's been treated with 2 rounds of gentamicin eyedrops. Started in the left eye and did initially improve with the gentamicin though recurred and is now on both eyes and has not improved with gentamicin this time. She notes no eye pain. No vision changes. No upper respiratory symptoms. Notes the eyes itch and are matted shut in the morning. She notes no photophobia.  Patient reports about a week ago she had a days worth of right lower quadrant abdominal pain. She has had no pelvic discomfort. Was constant. It went away. Has not recurred. No nausea, vomiting, diarrhea, dysuria, urinary symptoms, or vaginal discharge. She notes no pain at this time. She has been more constipated than usual since going on the ketogenic diet though does typically have a bowel movement once a day. Some days she does not have bowel movements. LMP was 11/04/16.  Active Ambulatory Problems    Diagnosis Date Noted  . Palpitations 10/18/2015  . Abdominal pain, right upper quadrant 10/18/2015  . Anxiety 10/18/2015  . Strain of left pectoralis muscle 11/18/2015  . Back pain 11/18/2015  . Fatty liver 02/24/2016  . Obesity 02/24/2016  . Musculoskeletal chest pain 05/11/2016  . Eczema 05/11/2016  . Encounter for general adult medical examination  with abnormal findings 11/27/2016  . RLQ abdominal pain 11/27/2016  . Conjunctivitis 11/27/2016   Resolved Ambulatory Problems    Diagnosis Date Noted  . Labor and delivery, indication for care 07/09/2015   Past Medical History:  Diagnosis Date  . Chickenpox   . Obesity     Family History  Problem Relation Age of Onset  . Cancer - Other Mother   . Breast cancer      Grandmother  . Prostate cancer      Grandfather  . Hypertension      Parent  . ALS      Grandparent    Social History   Social History  . Marital status: Single    Spouse name: N/A  . Number of children: N/A  . Years of education: N/A   Occupational History  . Not on file.   Social History Main Topics  . Smoking status: Former Research scientist (life sciences)  . Smokeless tobacco: Never Used  . Alcohol use 0.6 oz/week    1 Standard drinks or equivalent per week  . Drug use: No  . Sexual activity: Yes    Partners: Male   Other Topics Concern  . Not on file   Social History Narrative  . No narrative on file    ROS  General:  Negative for nexplained weight loss, fever Skin: Negative for new or changing mole, sore that won't heal HEENT: Negative for trouble hearing, trouble seeing, ringing in ears, mouth sores, hoarseness, change in voice, dysphagia. CV:  Negative for chest pain, dyspnea, edema, palpitations Resp:  Negative for cough, dyspnea, hemoptysis GI: Positive for abdominal pain, Negative for nausea, vomiting, diarrhea, constipation, melena, hematochezia. GU: Negative for dysuria, incontinence, urinary hesitance, hematuria, vaginal or penile discharge, polyuria, sexual difficulty, lumps in testicle or breasts MSK: Negative for muscle cramps or aches, joint pain or swelling Neuro: Negative for headaches, weakness, numbness, dizziness, passing out/fainting Psych: Negative for depression, anxiety, memory problems  Objective  Physical Exam Vitals:   11/27/16 1047  BP: 110/80  Pulse: 92  Temp: 99 F (37.2 C)      BP Readings from Last 3 Encounters:  11/27/16 110/80  11/11/16 120/80  05/11/16 112/70   Wt Readings from Last 3 Encounters:  11/27/16 246 lb 12.8 oz (111.9 kg)  05/11/16 243 lb (110.2 kg)  05/01/16 247 lb (112 kg)    Physical Exam  Constitutional: No distress.  HENT:  Head: Normocephalic and atraumatic.  Mouth/Throat: Oropharynx is clear and moist. No oropharyngeal exudate.  Eyes: Pupils are equal, round, and reactive to light. Right eye exhibits no discharge. Left eye exhibits no discharge.  Bilateral eyes with conjunctival erythema, no discharge noted, no gross corneal defects  Neck: Neck supple.  Cardiovascular: Normal rate, regular rhythm and normal heart sounds.   Pulmonary/Chest: Effort normal and breath sounds normal.  Abdominal: Soft. Bowel sounds are normal. She exhibits no distension. There is no tenderness. There is no rebound and no guarding.  Musculoskeletal: She exhibits no edema.  Lymphadenopathy:    She has no cervical adenopathy.  Neurological: She is alert. Gait normal.  Skin: Skin is warm and dry. She is not diaphoretic.  Psychiatric: Mood and affect normal.     Assessment/Plan:   Encounter for general adult medical examination with abnormal findings Physical exam completed. Discussed diet and exercise. Pelvic and breast exams done through gynecology. She was encouraged to set up a follow-up appointment with them for her yearly exam given her mother's history of uterine cancer. We'll obtain lab work as outlined below. Vaccinations up-to-date.  RLQ abdominal pain Patient with single episode of right lower quadrant pain. Has resolved. Has not recurred. Benign exam today. Urine pregnancy test is negative. Suspect related to constipation. We will obtain lab work as outlined below to evaluate for potential causes. She's given return precautions.  Conjunctivitis Patient with bilateral conjunctivitis initially responding to gentamicin drops. No obvious  purulent discharge at this time. We'll cover for bacterial conjunctivitis with Polytrim. Could be allergic or viral in nature. If not improving with Polytrim she will follow-up with her ophthalmologist.   Orders Placed This Encounter  Procedures  . Comp Met (CMET)  . CBC  . HgB A1c  . TSH  . POCT urine pregnancy    Meds ordered this encounter  Medications  . trimethoprim-polymyxin b (POLYTRIM) ophthalmic solution    Sig: Place 1 drop into both eyes every 6 (six) hours.    Dispense:  10 mL    Refill:  0     Tommi Rumps, MD [place]

## 2016-11-27 NOTE — Assessment & Plan Note (Addendum)
Patient with single episode of right lower quadrant pain. Has resolved. Has not recurred. Benign exam today. Urine pregnancy test is negative. Suspect related to constipation. We will obtain lab work as outlined below to evaluate for potential causes. She's given return precautions.

## 2016-11-30 ENCOUNTER — Telehealth: Payer: Self-pay | Admitting: Family Medicine

## 2016-11-30 NOTE — Telephone Encounter (Signed)
Pt called back returning your call. Thank you!  Call pt @ [phone]

## 2016-11-30 NOTE — Telephone Encounter (Signed)
CMA handled this already, thanks

## 2016-12-01 ENCOUNTER — Other Ambulatory Visit: Payer: Self-pay | Admitting: Family Medicine

## 2016-12-01 DIAGNOSIS — R7989 Other specified abnormal findings of blood chemistry: Secondary | ICD-10-CM

## 2016-12-07 DIAGNOSIS — H1033 Unspecified acute conjunctivitis, bilateral: Secondary | ICD-10-CM | POA: Diagnosis not present

## 2016-12-10 DIAGNOSIS — H1033 Unspecified acute conjunctivitis, bilateral: Secondary | ICD-10-CM | POA: Diagnosis not present

## 2016-12-25 DIAGNOSIS — H1033 Unspecified acute conjunctivitis, bilateral: Secondary | ICD-10-CM | POA: Diagnosis not present

## 2017-01-01 ENCOUNTER — Other Ambulatory Visit (INDEPENDENT_AMBULATORY_CARE_PROVIDER_SITE_OTHER): Payer: 59

## 2017-01-01 DIAGNOSIS — R7989 Other specified abnormal findings of blood chemistry: Secondary | ICD-10-CM

## 2017-01-01 DIAGNOSIS — R946 Abnormal results of thyroid function studies: Secondary | ICD-10-CM

## 2017-01-01 LAB — TSH: TSH: 10.54 u[IU]/mL — ABNORMAL HIGH (ref 0.35–4.50)

## 2017-01-04 ENCOUNTER — Telehealth: Payer: Self-pay

## 2017-01-04 DIAGNOSIS — E039 Hypothyroidism, unspecified: Secondary | ICD-10-CM

## 2017-01-04 MED ORDER — LEVOTHYROXINE SODIUM 75 MCG PO TABS: 75.0000 ug | ORAL_TABLET | Freq: Every day | ORAL | 3 refills | Status: DC

## 2017-01-04 NOTE — Telephone Encounter (Signed)
Sent to pharmacy. She needs a repeat TSH in 4-6 weeks.

## 2017-01-04 NOTE — Telephone Encounter (Signed)
-----   Message from Glori Luis, MD sent at 01/04/2017 10:52 AM EDT ----- Please let the patient know that her TSH is still elevated. She would benefit from being on a medication for hypothyroidism. If she is willing I can send this to her pharmacy. Thanks.

## 2017-01-04 NOTE — Telephone Encounter (Signed)
Left message to return call 

## 2017-01-04 NOTE — Telephone Encounter (Signed)
Patient notified and would like the prescription sent to [facility]

## 2017-01-05 NOTE — Telephone Encounter (Signed)
Left detailed message to notify and give [place] a call to schedule lab appointment

## 2017-02-12 ENCOUNTER — Other Ambulatory Visit (INDEPENDENT_AMBULATORY_CARE_PROVIDER_SITE_OTHER): Payer: 59

## 2017-02-12 DIAGNOSIS — E039 Hypothyroidism, unspecified: Secondary | ICD-10-CM

## 2017-02-12 LAB — TSH: TSH: 5.56 u[IU]/mL — ABNORMAL HIGH (ref 0.35–4.50)

## 2017-03-09 ENCOUNTER — Encounter: Payer: Self-pay | Admitting: Family Medicine

## 2017-04-09 ENCOUNTER — Telehealth: Payer: Self-pay | Admitting: *Deleted

## 2017-04-09 MED ORDER — LEVOTHYROXINE SODIUM 75 MCG PO TABS: 75.0000 ug | ORAL_TABLET | Freq: Every day | ORAL | 0 refills | Status: DC

## 2017-04-09 NOTE — Telephone Encounter (Signed)
[facility], thanks

## 2017-04-09 NOTE — Telephone Encounter (Signed)
Patient has requested a medication refill for levothyroxine 30 day supply  [facility]

## 2017-04-23 ENCOUNTER — Encounter: Payer: Self-pay | Admitting: Family Medicine

## 2017-04-23 ENCOUNTER — Ambulatory Visit (INDEPENDENT_AMBULATORY_CARE_PROVIDER_SITE_OTHER): Payer: 59 | Admitting: Family Medicine

## 2017-04-23 VITALS — BP 112/80 | HR 84 | Temp 98.7°F | Wt 252.0 lb

## 2017-04-23 DIAGNOSIS — E039 Hypothyroidism, unspecified: Secondary | ICD-10-CM

## 2017-04-23 DIAGNOSIS — F419 Anxiety disorder, unspecified: Secondary | ICD-10-CM

## 2017-04-23 DIAGNOSIS — E038 Other specified hypothyroidism: Secondary | ICD-10-CM

## 2017-04-23 LAB — TSH: TSH: 2.67 u[IU]/mL (ref 0.35–4.50)

## 2017-04-23 MED ORDER — LEVOTHYROXINE SODIUM 50 MCG PO TABS: 50.0000 ug | ORAL_TABLET | Freq: Every day | ORAL | 3 refills | Status: DC

## 2017-04-23 NOTE — Assessment & Plan Note (Signed)
Has somewhat worsened though some of this may be related to some of the symptoms she is having possibly related to the Synthroid. I do think this will improve with the decreased dose of Synthroid. Encouraged her to see her counselor if needed. She is hesitant to try any medications for anxiety.

## 2017-04-23 NOTE — Progress Notes (Signed)
  Marikay AlarEric Artyom Stencel, MD Phone: [phone]  Dana Brock is a 36 y.o. female who presents today for f/u.  Subclinical hypothyroidism: Patient previously started on Synthroid after having elevated TSHs on several occasions. She had been having some fatigue and tiredness prior to going on Synthroid. Since going on the Synthroid she has felt intermittently jittery and dizzy. Also notes her appetite is increased and her heart rate has gone up at times. She's had some heat and cold intolerance. She notes some days she is fine and other days she has symptoms.  She notes her anxiety has gone to another level as well. She was doing counseling though has not seen a counselor in some time. She notes no medications for this. She has no depression. She does note she's been more emotional.  ROS see history of present illness  Objective  Physical Exam Vitals:   04/23/17 1000 04/23/17 1024  BP: 112/80   Pulse: (!) 114 84  Temp: 98.7 F (37.1 C)   SpO2: 95%    Laying blood pressure 126/84 pulse 81 Sitting blood pressure 120/80 pulse 89 Standing blood pressure 112/78 pulse 97  BP Readings from Last 3 Encounters:  04/23/17 112/80  11/27/16 110/80  11/11/16 120/80   Wt Readings from Last 3 Encounters:  04/23/17 252 lb (114.3 kg)  11/27/16 246 lb 12.8 oz (111.9 kg)  05/11/16 243 lb (110.2 kg)    Physical Exam  Constitutional: No distress.  HENT:  Normal TMs bilaterally  Cardiovascular: Normal rate, regular rhythm and normal heart sounds.   Pulmonary/Chest: Effort normal and breath sounds normal.  Musculoskeletal: She exhibits no edema.  Neurological: She is alert. Gait normal.  Negative Dix-Hallpike  Skin: She is not diaphoretic.    Assessment/Plan: Please see individual problem list.  Subclinical hypothyroidism Now with new symptoms. Based on her labs and prior fatigue. It appears her symptoms have swung the opposite direction from hypothyroidism. Suspect her current symptoms  are related to the Synthroid. We will check a TSH. We'll decrease her dose of Synthroid. We'll recheck in about a month. Discussed that it may take 4-6 weeks to notice a huge difference in her symptoms.  Anxiety Has somewhat worsened though some of this may be related to some of the symptoms she is having possibly related to the Synthroid. I do think this will improve with the decreased dose of Synthroid. Encouraged her to see her counselor if needed. She is hesitant to try any medications for anxiety.   Orders Placed This Encounter  Procedures  . TSH    Meds ordered this encounter  Medications  . levothyroxine (SYNTHROID, LEVOTHROID) 50 MCG tablet    Sig: Take 1 tablet (50 mcg total) by mouth daily.    Dispense:  30 tablet    Refill:  3   Marikay AlarEric Hedda Crumbley, MD [facility] [place]

## 2017-04-23 NOTE — Assessment & Plan Note (Addendum)
Now with new symptoms. Based on her labs and prior fatigue. It appears her symptoms have swung the opposite direction from hypothyroidism. Suspect her current symptoms are related to the Synthroid. We will check a TSH. We'll decrease her dose of Synthroid. We'll recheck in about a month. Discussed that it may take 4-6 weeks to notice a huge difference in her symptoms.

## 2017-04-23 NOTE — Patient Instructions (Signed)
Nice to see you. We are going to decrease your dose of Synthroid. I sent this to your pharmacy. This will hopefully help with your symptoms. It may take a number of weeks for them to significantly improve. Please monitor your anxiety and if this worsens please see your counselor or let us know.

## 2017-04-30 ENCOUNTER — Encounter: Payer: Self-pay | Admitting: Family Medicine

## 2017-04-30 ENCOUNTER — Telehealth: Payer: Self-pay | Admitting: Family Medicine

## 2017-04-30 NOTE — Telephone Encounter (Signed)
Attempted to call the patient regarding her my chart message. Per the patient's DPR I left a message advising that we responded her my chart message and she needed to check this.

## 2017-05-11 ENCOUNTER — Ambulatory Visit: Payer: Self-pay | Admitting: Physician Assistant

## 2017-05-11 ENCOUNTER — Encounter: Payer: Self-pay | Admitting: Physician Assistant

## 2017-05-11 VITALS — BP 114/80 | HR 66 | Temp 97.8°F

## 2017-05-11 DIAGNOSIS — M25552 Pain in left hip: Secondary | ICD-10-CM

## 2017-05-11 MED ORDER — MELOXICAM 15 MG PO TABS: 15.0000 mg | ORAL_TABLET | Freq: Every day | ORAL | 6 refills | Status: DC

## 2017-05-11 NOTE — Progress Notes (Signed)
S: c/o left hip pain for few days, happens on and off for awhile, now hurts to stand, hurts to turn leg out, no known injury, some numbness in upper thigh from her back, didn't take any nsaids today  O: vitals wnl, nad, left hip is a little tender in groin area, decreased rom with internal rotation, legs = length, no rotation of foot, n/ vintact  A: hip pain  P: mobic 15mg  qd, will refer to ortho

## 2017-05-12 NOTE — Progress Notes (Signed)
Contacted  Ortho. Appointment scheduled with Dr. Darrelyn Hillock on 05/22/2017 @ 9:00 on Saturaday morning.  LM on patient vm of appointment day and time

## 2017-05-22 DIAGNOSIS — M25552 Pain in left hip: Secondary | ICD-10-CM | POA: Diagnosis not present

## 2017-05-28 ENCOUNTER — Ambulatory Visit: Payer: Self-pay | Admitting: Family Medicine

## 2017-06-02 ENCOUNTER — Telehealth: Payer: Self-pay | Admitting: *Deleted

## 2017-06-02 ENCOUNTER — Telehealth: Payer: Self-pay | Admitting: Family Medicine

## 2017-06-02 ENCOUNTER — Encounter (HOSPITAL_COMMUNITY): Payer: Self-pay | Admitting: *Deleted

## 2017-06-02 ENCOUNTER — Emergency Department (HOSPITAL_COMMUNITY): Payer: 59

## 2017-06-02 ENCOUNTER — Emergency Department (HOSPITAL_COMMUNITY)
Admission: EM | Admit: 2017-06-02 | Discharge: 2017-06-02 | Disposition: A | Discharge status: Home / Self Care | Payer: 59 | Attending: Emergency Medicine | Admitting: Emergency Medicine

## 2017-06-02 DIAGNOSIS — E039 Hypothyroidism, unspecified: Secondary | ICD-10-CM | POA: Diagnosis not present

## 2017-06-02 DIAGNOSIS — Z87891 Personal history of nicotine dependence: Secondary | ICD-10-CM | POA: Diagnosis not present

## 2017-06-02 DIAGNOSIS — Z79899 Other long term (current) drug therapy: Secondary | ICD-10-CM | POA: Insufficient documentation

## 2017-06-02 DIAGNOSIS — R079 Chest pain, unspecified: Secondary | ICD-10-CM | POA: Insufficient documentation

## 2017-06-02 DIAGNOSIS — R0789 Other chest pain: Secondary | ICD-10-CM | POA: Diagnosis not present

## 2017-06-02 DIAGNOSIS — R42 Dizziness and giddiness: Secondary | ICD-10-CM | POA: Diagnosis not present

## 2017-06-02 HISTORY — DX: Disorder of thyroid, unspecified: E07.9

## 2017-06-02 LAB — I-STAT BETA HCG BLOOD, ED (MC, WL, AP ONLY): I-stat hCG, quantitative: <5 m[IU]/mL (ref <5)

## 2017-06-02 LAB — URINALYSIS, ROUTINE W REFLEX MICROSCOPIC
Bilirubin Urine: NEGATIVE
GLUCOSE, UA: NEGATIVE mg/dL
Hgb urine dipstick: NEGATIVE
Ketones, ur: NEGATIVE mg/dL
Nitrite: NEGATIVE
Protein, ur: NEGATIVE mg/dL
Specific Gravity, Urine: 1.004 — ABNORMAL LOW (ref 1.005–1.030)
pH: 6 (ref 5.0–8.0)

## 2017-06-02 LAB — CBC
HCT: 41.6 % (ref 36.0–46.0)
Hemoglobin: 14.1 g/dL (ref 12.0–15.0)
MCH: 30.8 pg (ref 26.0–34.0)
MCHC: 33.9 g/dL (ref 30.0–36.0)
MCV: 90.8 fL (ref 78.0–100.0)
PLATELETS: 306 10*3/uL (ref 150–400)
RBC: 4.58 MIL/uL (ref 3.87–5.11)
RDW: 13.1 % (ref 11.5–15.5)
WBC: 8.6 10*3/uL (ref 4.0–10.5)

## 2017-06-02 LAB — BASIC METABOLIC PANEL
Anion gap: 11 (ref 5–15)
BUN: 11 mg/dL (ref 6–20)
CALCIUM: 9.3 mg/dL (ref 8.9–10.3)
CHLORIDE: 106 mmol/L (ref 101–111)
CO2: 22 mmol/L (ref 22–32)
CREATININE: 1.04 mg/dL — AB (ref 0.44–1.00)
GFR calc non Af Amer: >60 mL/min (ref >60)
Glucose, Bld: 105 mg/dL — ABNORMAL HIGH (ref 65–99)
Potassium: 4.1 mmol/L (ref 3.5–5.1)
SODIUM: 139 mmol/L (ref 135–145)

## 2017-06-02 LAB — TSH: TSH: 4.141 u[IU]/mL (ref 0.350–4.500)

## 2017-06-02 LAB — I-STAT TROPONIN, ED: TROPONIN I, POC: 0 ng/mL (ref 0.00–0.08)

## 2017-06-02 MED ORDER — MECLIZINE HCL 25 MG PO TABS
25.0000 mg | ORAL_TABLET | Freq: Once | ORAL | Status: DC
  Filled 2017-06-02: qty 1

## 2017-06-02 MED ORDER — MECLIZINE HCL 25 MG PO TABS: 25.0000 mg | ORAL_TABLET | Freq: Three times a day (TID) | ORAL | 0 refills | Status: DC | PRN

## 2017-06-02 NOTE — ED Triage Notes (Signed)
Pt reports ongoing dizziness, they thought it was related to thyroid medicine, they adjusted her dose and pt was feeling better. Having dizziness again and reports chest "soreness" more severe on left side and palpitations. ekg done at triage and no resp distress is noted.

## 2017-06-02 NOTE — Telephone Encounter (Signed)
[facility] [place] [facility] - [place] TELEPHONE ADVICE RECORD [facility]  Patient Name: Dana Brock  DOB: 14-May-1981    Initial Comment Pt is having dizziness and irregular heartbeat    Nurse Assessment  Nurse: Odis Luster, RN, Bjorn Loser Date/Time (Eastern Time): 06/02/2017 8:27:27 AM  Confirm and document reason for call. If symptomatic, describe symptoms. ---Pt is having dizziness and irregular heartbeat (last night). Reports that she has had this before and MD thought this was related to her thyroid. MD decreased her thyroid medication. She reports that she feels like she is not herself.  Does the patient have any new or worsening symptoms? ---Yes  Will a triage be completed? ---Yes  Related visit to physician within the last 2 weeks? ---No  Does the PT have any chronic conditions? (i.e. diabetes, asthma, etc.) ---Yes  List chronic conditions. ---hypothyroidism;  Is the patient pregnant or possibly pregnant? (Ask all females between the ages of 43-55) ---No  Is this a behavioral health or substance abuse call? ---No     Guidelines    Guideline Title Affirmed Question Affirmed Notes  Heart Rate and Heartbeat Questions Passed out (i.e., lost consciousness, collapsed and was not responding)    Final Disposition User   Call EMS 911 Now Odis Luster, RN, Bjorn Loser    Disagree/Comply: Danella Maiers

## 2017-06-02 NOTE — Discharge Instructions (Signed)
Please see the information and instructions below regarding your visit.  Your diagnoses today include:  1. Dizziness   2. Vertigo    Vertigo episodes can be caused by a problem with your peripheral vestibular system and ureter in her ear. Your exam was reassuring today that this dizziness is not originating from a brain problem.  Tests performed today include: -TSH-4.141 -Blood counts -Electrolytes -EKG -Urine -Chest x-ray  See side panel of your discharge paperwork for testing performed today. Vital signs are listed at the bottom of these instructions.   Medications prescribed:    Meclizine. This is an antihistamine medication. This medication has the potential to make you feel sleepy. Please be cautious with driving, working or operating machinery after taking this medication.  Take any prescribed medications only as prescribed, and any over the counter medications only as directed on the packaging.  Home care instructions:  Please follow any educational materials contained in this packet.   Follow-up instructions: Please follow-up with your primary care provider as soon as possible for further evaluation of your symptoms if they are not completely improved.   Please follow up with Dr. Jearld Fenton in ENT in as soon as possible The number is listed on your paperwork.  Return instructions:  Please return to the Emergency Department if you experience worsening symptoms.  Please return if he had worsening headache, dizziness that does not resolve, passing out episodes, changes in your vision, weakness in your extremities, or difficulty with your gait or coordination. Please return if you have any other emergent concerns.  Additional Information:  Your vital signs today were: BP 117/76    Pulse 77    Temp 98.4 F (36.9 C) (Oral)    Resp 16    LMP 05/02/2017    SpO2 99%  If your blood pressure (BP) was elevated on multiple readings during this visit above 130 for the top number or  above 80 for the bottom number, please have this repeated by your primary care provider within one month. --------------  Thank you for allowing [place] to participate in your care today.

## 2017-06-02 NOTE — ED Provider Notes (Signed)
[facility] DEPT Provider Note   CSN: [id number] Arrival date & time: 06/02/17  0850     History   Chief Complaint Chief Complaint  Patient presents with  . Dizziness  . Chest Pain    HPI Dana Brock is a 36 y.o. female.  HPI   Patient is a 36 year old female with a history of hypothyroidism (not on thyroid hormone replacement) presenting for an acute episode of dizziness this morning. Patient reports that she was standing at her desk and felt that she was spinning. Patient worried that she would follow her pass out. On presentation to the emergency Department patient is asymptomatic of vertiginous symptoms, however she feels "not right". Patient reports and head pressure but new pain, and some chest discomfort, but new chest pain, shortness of breath, nausea, vomiting, or presyncope. Patient denies any extremity weakness, vision changes, dysarthria, difficulty with ambulation or coordination, or numbness or tingling of e he is as you. Cough splinted here in our is a her extemities. Patient was on levothyroxine up until 4 weeks ago due to elevated TSH but this was discontinued due to tachycardia.  Past Medical History:  Diagnosis Date  . Chickenpox   . Obesity   . Thyroid disease     Patient Active Problem List   Diagnosis Date Noted  . Subclinical hypothyroidism 04/23/2017  . Encounter for general adult medical examination with abnormal findings 11/27/2016  . RLQ abdominal pain 11/27/2016  . Conjunctivitis 11/27/2016  . Musculoskeletal chest pain 05/11/2016  . Eczema 05/11/2016  . Fatty liver 02/24/2016  . Obesity 02/24/2016  . Strain of left pectoralis muscle 11/18/2015  . Back pain 11/18/2015  . Palpitations 10/18/2015  . Abdominal pain, right upper quadrant 10/18/2015  . Anxiety 10/18/2015    Past Surgical History:  Procedure Laterality Date  . CESAREAN SECTION N/A 07/09/2015   Procedure: CESAREAN SECTION;  Surgeon: Vena Austria, MD;  Location:  [facility] ORS;  Service: Obstetrics;  Laterality: N/A;  . NO PAST SURGERIES      OB History    Gravida Para Term Preterm AB Living   0 0 1   SAB TAB Ectopic Multiple Live Births   0 0 0 0 1       Home Medications    Prior to Admission medications   Medication Sig Start Date End Date Taking? Authorizing Provider  levothyroxine (SYNTHROID, LEVOTHROID) 50 MCG tablet Take 1 tablet (50 mcg total) by mouth daily. Patient not taking: Reported on 05/11/2017 04/23/17   Glori Luis, MD  meloxicam (MOBIC) 15 MG tablet Take 1 tablet (15 mg total) by mouth daily. 05/11/17   Faythe Ghee, PA-C    Family History Family History  Problem Relation Age of Onset  . Cancer - Other Mother   . Breast cancer Unknown        Grandmother  . Prostate cancer Unknown        Grandfather  . Hypertension Unknown        Parent  . ALS Unknown        Grandparent    Social History Social History  Substance Use Topics  . Smoking status: Former Games developer  . Smokeless tobacco: Never Used  . Alcohol use 0.6 oz/week    1 Standard drinks or equivalent per week     Allergies   Penicillins and Meloxicam   Review of Systems Review of Systems  Constitutional: Negative for chills and fever.  HENT: Negative for congestion, rhinorrhea, sinus pain  and sore throat.   Eyes: Negative for visual disturbance.  Respiratory: Positive for chest tightness. Negative for cough and shortness of breath.   Cardiovascular: Negative for chest pain, palpitations and leg swelling.  Gastrointestinal: Negative for abdominal pain, constipation, diarrhea, nausea and vomiting.  Genitourinary: Negative for dysuria and flank pain.  Musculoskeletal: Negative for back pain and myalgias.  Neurological: Positive for dizziness and light-headedness. Negative for syncope, facial asymmetry, speech difficulty, weakness, numbness and headaches.  Psychiatric/Behavioral: Negative for confusion.    Physical Exam Updated Vital  Signs BP 117/76   Pulse 77   Temp 98.4 F (36.9 C) (Oral)   Resp 16   LMP 05/02/2017   SpO2 99%   Physical Exam  Constitutional: She appears well-developed and well-nourished. No distress.  HENT:  Head: Normocephalic and atraumatic.  Mouth/Throat: Oropharynx is clear and moist.  Eyes: Pupils are equal, round, and reactive to light. Conjunctivae and EOM are normal.  Neck: Normal range of motion. Neck supple.  Cardiovascular: Normal rate, regular rhythm, S1 normal and S2 normal.   No murmur heard. Pulmonary/Chest: Effort normal and breath sounds normal. She has no wheezes. She has no rales.  Abdominal: Soft. She exhibits no distension. There is no tenderness. There is no guarding.  Musculoskeletal: Normal range of motion. She exhibits no edema or deformity.  Lymphadenopathy:    She has no cervical adenopathy.  Neurological: She is alert.  Mental Status:  Alert, oriented, thought content appropriate, able to give a coherent history. Speech fluent without evidence of aphasia. Able to follow 2 step commands without difficulty.  Cranial Nerves:  II:  Peripheral visual fields grossly normal, pupils equal, round, reactive to light III,IV, VI: ptosis not present, extra-ocular motions intact bilaterally. Horizontal right beating nystagmus bilaterally with tested EOMs. No evidence of skew. V,VII: smile symmetric, facial light touch sensation equal VIII: hearing grossly normal to voice  X: uvula elevates symmetrically  XI: bilateral shoulder shrug symmetric and strong XII: midline tongue extension without fassiculations Motor:  Normal tone. 5/5 in upper and lower extremities bilaterally including strong and equal grip strength and dorsiflexion/plantar flexion Sensory: Pinprick and light touch normal in all extremities.  Deep Tendon Reflexes: 2+ and symmetric in the Achilles  and patella. No clonus. Cerebellar: normal finger-to-nose with bilateral upper extremities Gait: normal gait and  balance Stance: No pronator drift and good coordination, strength, and position sense with tapping of bilateral arms (performed in sitting position). Normal correlation with heel-to-shin. No evidence of ataxia. CV: distal pulses palpable throughout    Skin: Skin is warm and dry. No rash noted. No erythema.  Psychiatric: She has a normal mood and affect. Her behavior is normal. Judgment and thought content normal.  Nursing note and vitals reviewed.    ED Treatments / Results  Labs (all labs ordered are listed, but only abnormal results are displayed) Labs Reviewed  BASIC METABOLIC PANEL - Abnormal; Notable for the following:       Result Value   Glucose, Bld 105 (*)    Creatinine, Ser 1.04 (*)    All other components within normal limits  URINALYSIS, ROUTINE W REFLEX MICROSCOPIC - Abnormal; Notable for the following:    Color, Urine STRAW (*)    Specific Gravity, Urine 1.004 (*)    Leukocytes, UA SMALL (*)    Bacteria, UA RARE (*)    Squamous Epithelial / LPF 0-5 (*)    All other components within normal limits  CBC  TSH  I-STAT TROPONIN, ED  I-STAT BETA HCG BLOOD, ED ([facility], [facility], [facility] ONLY)    EKG  EKG Interpretation None       Radiology Dg Chest 2 View  Result Date: 06/02/2017 CLINICAL DATA:  Left side chest pain, dizziness, SOB since yesterday - no known heart or lung issues, nonsmoker EXAM: CHEST  2 VIEW COMPARISON:  None. FINDINGS: Midline trachea. Normal heart size and mediastinal contours. No pleural effusion or pneumothorax. Clear lungs. IMPRESSION: Normal chest. Electronically Signed   By: Jeronimo Greaves M.D.   On: 06/02/2017 10:42    Procedures Procedures (including critical care time)  Medications Ordered in ED Medications  meclizine (ANTIVERT) tablet 25 mg (not administered)     Initial Impression / Assessment and Plan / ED Course  I have reviewed the triage vital signs and the nursing notes.  Pertinent labs & imaging results that were available during my  care of the patient were reviewed by me and considered in my medical decision making (see chart for details).      Final Clinical Impressions(s) / ED Diagnoses   Final diagnoses:  None   MDM  Patient is a 36 year old female with a history of hypothyroidism (not on thyroid hormone replacement) presenting for an acute episode of dizziness this morning. Patient has a history of hypothyroidism and was previously treated on levothyroxine. TSH tested today and is within normal limits at 4.141. No risk factors on history or exam for PE. Well's score and PERC score 0.Patient is ambulatory, has good coordination, no evidence of ataxia or dysmetria, and is completely neurologically intact. No concerning central signs of vertigo. Patient has right beating horizontal nystagmus on HINTS exam. Due to episodes of vertigo preempted by lateral rotation of the head, and no concerning signs for central vertigo and suspect peripheral vertigo at this time. Patient was well-appearing and asymptomatic upon discharge from the emergency department today. Patient was given strict return precautions for any changes in neurologic status such as confusion, changes in vision, weakness, or unresolving episodes of dizziness. Patient given referral information for otolaryngology, and follow-up with primary care provider.  Patient is noted to have some asymptomatic bacteriuria on urinalysis today. Discussed this with patient and that this is not an indication to treat without symptoms.  New Prescriptions New Prescriptions   No medications on file       Delia Chimes 06/03/17 0214    Mancel Bale, MD 06/03/17 [id number]

## 2017-06-02 NOTE — Telephone Encounter (Signed)
Pt is  having extreme dizziness, irregular heartbeat and chest discomfort. Pt stated that this could be medication related, however she's not sure.  *pt was transferred to [facility]

## 2017-06-02 NOTE — Telephone Encounter (Signed)
Patient stated she is at the ED now.

## 2017-06-09 DIAGNOSIS — R42 Dizziness and giddiness: Secondary | ICD-10-CM | POA: Diagnosis not present

## 2017-06-09 DIAGNOSIS — H8120 Vestibular neuronitis, unspecified ear: Secondary | ICD-10-CM | POA: Diagnosis not present

## 2017-06-22 ENCOUNTER — Ambulatory Visit (INDEPENDENT_AMBULATORY_CARE_PROVIDER_SITE_OTHER): Payer: 59 | Admitting: Family Medicine

## 2017-06-22 ENCOUNTER — Encounter: Payer: Self-pay | Admitting: Family Medicine

## 2017-06-22 VITALS — BP 114/72 | HR 75 | Temp 98.5°F | Wt 250.2 lb

## 2017-06-22 DIAGNOSIS — E039 Hypothyroidism, unspecified: Secondary | ICD-10-CM

## 2017-06-22 DIAGNOSIS — R42 Dizziness and giddiness: Secondary | ICD-10-CM | POA: Diagnosis not present

## 2017-06-22 DIAGNOSIS — Z6838 Body mass index (BMI) 38.0-38.9, adult: Secondary | ICD-10-CM | POA: Diagnosis not present

## 2017-06-22 DIAGNOSIS — R002 Palpitations: Secondary | ICD-10-CM | POA: Diagnosis not present

## 2017-06-22 DIAGNOSIS — E6609 Other obesity due to excess calories: Secondary | ICD-10-CM | POA: Diagnosis not present

## 2017-06-22 DIAGNOSIS — E038 Other specified hypothyroidism: Secondary | ICD-10-CM

## 2017-06-22 NOTE — Assessment & Plan Note (Signed)
Started to feel little more sluggish with this. She's been off medication. Given adverse reaction to Synthroid we'll refer to endocrinology for further evaluation.

## 2017-06-22 NOTE — Patient Instructions (Signed)
Nice to see you. We are going to refer you to endocrinology for your hypothyroidism.  Please continue with diet and exercise. We will have see a cardiologist as well for your palpitations.

## 2017-06-22 NOTE — Assessment & Plan Note (Signed)
Patient's described symptoms seem consistent with PVCs. Given persistence we'll refer her to cardiology for further evaluation and consideration of Holter monitor. Given return precautions.

## 2017-06-22 NOTE — Assessment & Plan Note (Signed)
Encouraged continued diet and exercise. 

## 2017-06-22 NOTE — Progress Notes (Signed)
  Marikay Alar, MD Phone: [phone]  Dana Brock is a 36 y.o. female who presents today for follow-up.  Patient evaluated in the emergency room for vertigo. Deemed to be a peripheral cause. Had dizziness and spinning. Felt a slight heart arrhythmia as well at that time. She was referred to ENT who gave her a steroid and her symptoms have improved quite a bit. Occasionally does have slight spinning sensation.  She notes intermittent palpitations that she describes as 1 or 2 beats that are fairly strong and having a dull achy sensation with it. Does not last long and goes away on its own. Occurs every 1-2 days. Typically just when she is sitting or standing. Not when exerting herself.  Patient's been off of Synthroid for about a month now. She feels quite a bit more sluggish off of it though her heart rate has come down significantly and she has not had any heart racing episodes.  Obesity: She has gotten back to working out. Doing weights and cardio 4 times a week. She is eating healthier. No sodas. In meal prep. More water.  PMH: Former smoker   ROS see history of present illness  Objective  Physical Exam Vitals:   06/22/17 0954  BP: 114/72  Pulse: 75  Temp: 98.5 F (36.9 C)  SpO2: 96%    BP Readings from Last 3 Encounters:  06/22/17 114/72  06/02/17 130/86  05/11/17 114/80   Wt Readings from Last 3 Encounters:  06/22/17 250 lb 3.2 oz (113.5 kg)  04/23/17 252 lb (114.3 kg)  11/27/16 246 lb 12.8 oz (111.9 kg)    Physical Exam  Constitutional: No distress.  Cardiovascular: Normal rate, regular rhythm and normal heart sounds.   Pulmonary/Chest: Effort normal and breath sounds normal.  Musculoskeletal: She exhibits no edema.  Neurological: She is alert. Gait normal.  Skin: Skin is warm and dry. She is not diaphoretic.     Assessment/Plan: Please see individual problem list.  Subclinical hypothyroidism Started to feel little more sluggish with this.  She's been off medication. Given adverse reaction to Synthroid we'll refer to endocrinology for further evaluation.  Palpitations Patient's described symptoms seem consistent with PVCs. Given persistence we'll refer her to cardiology for further evaluation and consideration of Holter monitor. Given return precautions.  Obesity Encouraged continued diet and exercise.  Vertigo Seems to be have been peripheral. Has improved. She'll monitor for further improvement. If it recurs she'll let us know.   Orders Placed This Encounter  Procedures  . Ambulatory referral to Endocrinology    Referral Priority:   Routine    Referral Type:   Consultation    Referral Reason:   Specialty Services Required    Number of Visits Requested:   1  . Ambulatory referral to Cardiology    Referral Priority:   Routine    Referral Type:   Consultation    Referral Reason:   Specialty Services Required    Requested Specialty:   Cardiology    Number of Visits Requested:   1   Marikay Alar, MD [facility]

## 2017-06-22 NOTE — Assessment & Plan Note (Signed)
Seems to be have been peripheral. Has improved. She'll monitor for further improvement. If it recurs she'll let us know.

## 2017-08-11 ENCOUNTER — Ambulatory Visit: Payer: Self-pay | Admitting: Emergency Medicine

## 2017-08-11 VITALS — BP 120/80 | HR 94 | Temp 97.8°F

## 2017-08-11 DIAGNOSIS — J069 Acute upper respiratory infection, unspecified: Secondary | ICD-10-CM

## 2017-08-11 MED ORDER — AZITHROMYCIN 250 MG PO TABS: ORAL_TABLET | ORAL | 0 refills | Status: DC

## 2017-08-11 MED ORDER — PSEUDOEPH-BROMPHEN-DM 30-2-10 MG/5ML PO SYRP: 5.0000 mL | ORAL_SOLUTION | Freq: Four times a day (QID) | ORAL | 0 refills | Status: DC | PRN

## 2017-08-11 NOTE — Progress Notes (Signed)
S:  Is here complaining of facial pressure, nasal discharge, headache, and bloody mucus when blowing her nose. She also has tenderness of her left lymphnode.  Occasional cough.  No fever or chills. O:  TMs are dull bilaterally with mild fluid present. Nasal mucosa boggy. Posterior pharynx moderate inject and drainage seen. Neck is supple without adenopathy. Lungs are clear bilaterally.  Sinus non-tender at present. A:  Viral URI       Sinus pressure P:  Patient was given prescriptions for  Bromfed DM and  a prescription for Z-Pak was sent.   If she continues to have to have sinus pain or drainage for another 5 days she will begin on the z-pak.

## 2017-08-14 DIAGNOSIS — E063 Autoimmune thyroiditis: Secondary | ICD-10-CM

## 2017-08-14 HISTORY — DX: Autoimmune thyroiditis: E06.3

## 2017-09-09 ENCOUNTER — Encounter: Payer: Self-pay | Admitting: Internal Medicine

## 2017-09-09 ENCOUNTER — Ambulatory Visit (INDEPENDENT_AMBULATORY_CARE_PROVIDER_SITE_OTHER): Payer: 59 | Admitting: Internal Medicine

## 2017-09-09 VITALS — BP 136/70 | HR 89 | Ht 68.75 in | Wt 247.6 lb

## 2017-09-09 DIAGNOSIS — E038 Other specified hypothyroidism: Secondary | ICD-10-CM

## 2017-09-09 DIAGNOSIS — E039 Hypothyroidism, unspecified: Secondary | ICD-10-CM | POA: Diagnosis not present

## 2017-09-09 LAB — T3, FREE: T3 FREE: 3.4 pg/mL (ref 2.3–4.2)

## 2017-09-09 LAB — T4, FREE: FREE T4: 0.68 ng/dL (ref 0.60–1.60)

## 2017-09-09 LAB — TSH: TSH: 14.22 u[IU]/mL — AB (ref 0.35–4.50)

## 2017-09-09 NOTE — Progress Notes (Signed)
Patient ID: Dana Brock, female   DOB: 12/06/1980, 36 y.o.   MRN: [id number]    HPI  Dana Brock is a 36 y.o.-year-old female, referred by her PCP, Dr. Birdie Sons, for management of subclinical hypothyroidism.  Pt. has been dx with hypothyroidism in 11/2016 >> started on Levothyroxine 75 mcg daily but felt more anxious, tachycardic (120 bpm), hungry (but did feel less sluggish) >> stopped in 05/2017.   She went to the ED in 05/2017 for vertigo, nystagmus, palpitations >> sent to ENT >> given a steroid. A TSH returned normal. She feels better now, but continues to have palpitations + SOB and fatigue. She will see cardiology soon.  She was taking the thyroid hormone: - fasting - with water - separated by >30-60 min from b'fast  - no calcium, iron, PPIs, multivitamins   I reviewed pt's thyroid tests: Lab Results  Component Value Date   TSH 4.141 06/02/2017   TSH 2.67 04/23/2017   TSH 5.56 (H) 02/12/2017   TSH 10.54 (H) 01/01/2017   TSH 7.04 (H) 11/27/2016   TSH 3.47 10/18/2015   FREET4 0.77 11/27/2016   FREET4 0.69 11/18/2015    Pt describes: - + weight gain - + fatigue, + poor sleep - no cold intolerance - no depression - no constipation - no dry skin - + hair loss  Pt denies feeling nodules in neck, hoarseness, + occasionally dysphagia/odynophagia, + occasional SOB with lying down.  She has + FH of thyroid disorders in: sister with Hashimoto's thyroiditis; father, mother, both GM's >> hypothyroidism. No FH of thyroid cancer.  No h/o radiation tx to head or neck, however, she works in radiology.  No recent use of iodine supplements. Takes Magnesium.  She exercises 4x a week.  ROS: Constitutional: + see HPI Eyes: no blurry vision, no xerophthalmia ENT: no sore throat, + see HPI, + occasional tinnitus Cardiovascular: + CP/+ SOB/no palpitations/no leg swelling Respiratory: no cough/+ SOB Gastrointestinal: + N/no V/D/C/+ heartburn Musculoskeletal: +  muscle/+ joint aches Skin: no rashes, + hair loss Neurological: no tremors/numbness/tingling/dizziness, + HA Psychiatric: no depression/+ anxiety  Past Medical History:  Diagnosis Date  . Chickenpox   . Obesity   . Thyroid disease    Past Surgical History:  Procedure Laterality Date  . CESAREAN SECTION N/A 07/09/2015   Procedure: CESAREAN SECTION;  Surgeon: Vena Austria, MD;  Location: [facility] ORS;  Service: Obstetrics;  Laterality: N/A;  . NO PAST SURGERIES     Social History   Socioeconomic History  . Marital status: Single    Spouse name: Not on file  . Number of children: 2 and 21 y/o  Social Needs  Occupational History  . X ray tech [employer]  Tobacco Use  . Smoking status: Former Smoker, quit 2012  . Smokeless tobacco: Never Used  Substance and Sexual Activity  . Alcohol use: Yes    Alcohol/week: 0.6 oz    Types: 1-2 Standard drinks or equivalent per mo  . Drug use: No   No current outpatient medications on file.   No current facility-administered medications for this visit.    Allergies  Allergen Reactions  . Penicillin G Hives  . Penicillins Hives  . Meloxicam    Family History  Problem Relation Age of Onset  . Cancer - Other Mother   . Breast cancer Unknown        Grandmother  . Prostate cancer Unknown        Grandfather  . Hypertension Unknown  Parent  . ALS Unknown        Grandparent    PE: BP 136/70   Pulse 89   Ht 5' 8.75" (1.746 m)   Wt 247 lb 9.6 oz (112.3 kg)   LMP 09/07/2017   SpO2 98%   BMI 36.83 kg/m  Wt Readings from Last 3 Encounters:  09/09/17 247 lb 9.6 oz (112.3 kg)  06/22/17 250 lb 3.2 oz (113.5 kg)  04/23/17 252 lb (114.3 kg)   Constitutional: overweight, in NAD Eyes: PERRLA, EOMI, no exophthalmos ENT: moist mucous membranes, no thyromegaly, no cervical lymphadenopathy Cardiovascular: RRR, No MRG Respiratory: CTA B Gastrointestinal: abdomen soft, NT, ND, BS+ Musculoskeletal: no deformities, strength  intact in all 4 Skin: moist, warm, no rashes Neurological: no tremor with outstretched hands, DTR normal in all 4  ASSESSMENT: 1. Hypothyroidism  PLAN:  1. Patient with long-standing hypothyroidism, with previous intolerance to levothyroxine therapy, likely 2/2 starting at a high dose. - We discussed about subclinical hypothyroidism in general and also about indications for treatment:  Desire for pregnancy  TSH higher than 10  TSH lower than 10 with signs or symptoms consistent with hypothyroidism - she appears euthyroid, but has some fatigue (also has a toddler at home) and also weight gain - she does not appear to have a goiter, thyroid nodules, but does have some mild neck compression symptoms - we discussed about  Hashimoto thyroiditis as she has an extensive FH of either this condition or hypothyroidism (possible also related to increased thyroid Ab's). I explained that this is an autoimmune disorder, in which she develops antibodies against her own thyroid. The antibodies bind to the thyroid tissue and cause inflammation, and, eventually, destruction of the gland and hypothyroidism. We don't know how long this process can be, it can last from months to years.  - I also explained that thyroid enlargement especially at the beginning of her Hashimoto thyroiditis course is not uncommon, and it has a waxing and waning character. She does have some neck compression sxs which are recurrent. - We discussed about treatment for Hashimoto thyroiditis, which is actually limited to thyroid hormones in case her TFTs are abnormal. Supplements like selenium has been tried with various results, some showing improvement in the TPO antibodies. However, there are no randomized controlled trials of this are consistent results between trials. We also discussed about ways to improve her immune system (relaxation, diet, exercise, sleep) to reduce the Ab titer and, subsequently, the thyroid inflammation. - we  decided to check the following thyroid tests today:  Orders Placed This Encounter  Procedures  . TSH  . T4, free  . T3, free  . Thyroglobulin antibody  . Thyroid peroxidase antibody  - if TSH is >10, we will need to start LT4, but likely at 25 mcg daily - We discussed about correct intake of levothyroxine, if we need to start, fasting, with water, separated by at least 30 minutes from breakfast, and separated by more than 4 hours from calcium, iron, multivitamins, acid reflux medications (PPIs). - If labs today are abnormal, she will need to return in ~6 weeks for repeat labs - Otherwise, I will see her back in 6 months  Component     Latest Ref Rng & Units 09/09/2017  TSH     0.35 - 4.50 uIU/mL 14.22 (H)  Triiodothyronine,Free,Serum     2.3 - 4.2 pg/mL 3.4  T4,Free(Direct)     0.60 - 1.60 ng/dL 1.610.68  Thyroglobulin Ab     <  or = 1 IU/mL 62 (H)  Thyroperoxidase Ab SerPl-aCnc     <9 IU/mL 3   Subclinical hypothyroidism, with a high TSH, meeting criteria for treatment (see above).  Thyroglobulin antibodies are also high, confirming Hashimoto's thyroiditis.  Will start levothyroxine 25 mcg daily and repeat her TFTs in 6 weeks.  Carlus Pavlovristina Graydon Fofana, MD PhD [facility]

## 2017-09-09 NOTE — Patient Instructions (Addendum)
Please stop at the lab.  If we need to restart Levothyroxine, Take the thyroid hormone every day, with water, at least 30 minutes before breakfast, separated by at least 4 hours from: - acid reflux medications - calcium - iron - multivitamins  Please return in 6 months with your sugar log.    Hypothyroidism Hypothyroidism is a disorder of the thyroid. The thyroid is a large gland that is located in the lower front of the neck. The thyroid releases hormones that control how the body works. With hypothyroidism, the thyroid does not make enough of these hormones. What are the causes? Causes of hypothyroidism may include:  Viral infections.  Pregnancy.  Your own defense system (immune system) attacking your thyroid.  Certain medicines.  Birth defects.  Past radiation treatments to your head or neck.  Past treatment with radioactive iodine.  Past surgical removal of part or all of your thyroid.  Problems with the gland that is located in the center of your brain (pituitary).  What are the signs or symptoms? Signs and symptoms of hypothyroidism may include:  Feeling as though you have no energy (lethargy).  Inability to tolerate cold.  Weight gain that is not explained by a change in diet or exercise habits.  Dry skin.  Coarse hair.  Menstrual irregularity.  Slowing of thought processes.  Constipation.  Sadness or depression.  How is this diagnosed? Your health care provider may diagnose hypothyroidism with blood tests and ultrasound tests. How is this treated? Hypothyroidism is treated with medicine that replaces the hormones that your body does not make. After you begin treatment, it may take several weeks for symptoms to go away. Follow these instructions at home:  Take medicines only as directed by your health care provider.  If you start taking any new medicines, tell your health care provider.  Keep all follow-up visits as directed by your health care  provider. This is important. As your condition improves, your dosage needs may change. You will need to have blood tests regularly so that your health care provider can watch your condition. Contact a health care provider if:  Your symptoms do not get better with treatment.  You are taking thyroid replacement medicine and: ? You sweat excessively. ? You have tremors. ? You feel anxious. ? You lose weight rapidly. ? You cannot tolerate heat. ? You have emotional swings. ? You have diarrhea. ? You feel weak. Get help right away if:  You develop chest pain.  You develop an irregular heartbeat.  You develop a rapid heartbeat. This information is not intended to replace advice given to you by your health care provider. Make sure you discuss any questions you have with your health care provider. Document Released: 08/31/2005 Document Revised: 02/06/2016 Document Reviewed: 01/16/2014 Elsevier Interactive Patient Education  2018 [employer]

## 2017-09-10 LAB — THYROID PEROXIDASE ANTIBODY: THYROID PEROXIDASE ANTIBODY: 3 [IU]/mL (ref <9)

## 2017-09-10 LAB — THYROGLOBULIN ANTIBODY: Thyroglobulin Ab: 62 IU/mL — ABNORMAL HIGH (ref <=1)

## 2017-09-10 MED ORDER — LEVOTHYROXINE SODIUM 25 MCG PO TABS: 25.0000 ug | ORAL_TABLET | Freq: Every day | ORAL | 1 refills | Status: DC

## 2017-09-15 ENCOUNTER — Encounter: Payer: Self-pay | Admitting: Obstetrics and Gynecology

## 2017-09-15 ENCOUNTER — Ambulatory Visit (INDEPENDENT_AMBULATORY_CARE_PROVIDER_SITE_OTHER): Payer: 59 | Admitting: Obstetrics and Gynecology

## 2017-09-15 DIAGNOSIS — Z01419 Encounter for gynecological examination (general) (routine) without abnormal findings: Secondary | ICD-10-CM | POA: Diagnosis not present

## 2017-09-15 DIAGNOSIS — R0602 Shortness of breath: Secondary | ICD-10-CM | POA: Diagnosis not present

## 2017-09-15 NOTE — Progress Notes (Signed)
Gynecology Annual Exam  PCP: Glori LuisSonnenberg, Eric G, MD  Chief Complaint:  Chief Complaint  Patient presents with  . Gynecologic Exam    History of Present Illness: Patient is a 37 y.o. G1P1001 presents for annual exam. The patient has no complaints today.   LMP: Patient's last menstrual period was 09/07/2017. Average Interval: regular, 28 days Duration of flow: 5 days Heavy Menses: no Clots: no Intermenstrual Bleeding: no Postcoital Bleeding: no Dysmenorrhea: no  The patient is sexually active. She currently uses condoms for contraception. She denies dyspareunia.  The patient does perform self breast exams.  There is no notable family history of breast or ovarian cancer in her family.  The patient wears seatbelts: yes.   The patient has regular exercise: not asked.    The patient denies current symptoms of depression.   Last menstrual cycle was slightly heavier and irregular in timing.  Currently having synthroid levels adjusted.   Review of Systems: Review of Systems  Constitutional: Negative for chills and fever.  HENT: Negative for congestion.   Respiratory: Negative for cough and shortness of breath.   Cardiovascular: Negative for chest pain and palpitations.  Gastrointestinal: Negative for abdominal pain, constipation, diarrhea, heartburn, nausea and vomiting.  Genitourinary: Negative for dysuria, frequency and urgency.  Skin: Negative for itching and rash.  Neurological: Negative for dizziness and headaches.  Endo/Heme/Allergies: Negative for polydipsia.  Psychiatric/Behavioral: Negative for depression.    Past Medical History:  Past Medical History:  Diagnosis Date  . Chickenpox   . Hashimoto's thyroiditis 08/2017  . Obesity   . Thyroid disease     Past Surgical History:  Past Surgical History:  Procedure Laterality Date  . CESAREAN SECTION N/A 07/09/2015   Procedure: CESAREAN SECTION;  Surgeon: Vena AustriaAndreas Auriana Scalia, MD;  Location: [facility] ORS;  Service:  Obstetrics;  Laterality: N/A;  . NO PAST SURGERIES      Gynecologic History:  Patient's last menstrual period was 09/07/2017. Contraception: condoms Last Pap: Results were:09/02/2015 NIL and HR HPV negative   Obstetric History: G1P1001  Family History:  Family History  Problem Relation Age of Onset  . Cancer - Other Mother   . Breast cancer Unknown        Grandmother  . Prostate cancer Unknown        Grandfather  . Hypertension Unknown        Parent  . ALS Unknown        Grandparent    Social History:  Social History   Socioeconomic History  . Marital status: Single    Spouse name: Not on file  . Number of children: Not on file  . Years of education: Not on file  . Highest education level: Not on file  Social Needs  . Financial resource strain: Not on file  . Food insecurity - worry: Not on file  . Food insecurity - inability: Not on file  . Transportation needs - medical: Not on file  . Transportation needs - non-medical: Not on file  Occupational History  . Not on file  Tobacco Use  . Smoking status: Former Games developermoker  . Smokeless tobacco: Never Used  Substance and Sexual Activity  . Alcohol use: Yes    Alcohol/week: 0.6 oz    Types: 1 Standard drinks or equivalent per week  . Drug use: No  . Sexual activity: Yes    Partners: Male  Other Topics Concern  . Not on file  Social History Narrative  . Not on file  Allergies:  Allergies  Allergen Reactions  . Penicillin G Hives  . Penicillins Hives  . Meloxicam     Medications: Prior to Admission medications   Medication Sig Start Date End Date Taking? Authorizing Provider  levothyroxine (SYNTHROID, LEVOTHROID) 25 MCG tablet Take 1 tablet (25 mcg total) by mouth daily before breakfast. 09/10/17   Carlus Pavlov, MD    Physical Exam Vitals: Blood pressure 104/68, pulse 77, height 5\' 8"  (1.727 m), weight 248 lb (112.5 kg), last menstrual period 09/07/2017.  General: NAD HEENT: normocephalic,  anicteric Thyroid: no enlargement, no palpable nodules Pulmonary: No increased work of breathing, CTAB Cardiovascular: RRR, distal pulses 2+ Breast: Breast symmetrical, no tenderness, no palpable nodules or masses, no skin or nipple retraction present, no nipple discharge.  No axillary or supraclavicular lymphadenopathy. Abdomen: NABS, soft, non-tender, non-distended.  Umbilicus without lesions.  No hepatomegaly, splenomegaly or masses palpable. No evidence of hernia  Genitourinary:  External: Normal external female genitalia.  Normal urethral meatus, normal  Bartholin's and Skene's glands.    Vagina: Normal vaginal mucosa, no evidence of prolapse.    Cervix: Grossly normal in appearance, no bleeding  Uterus: Non-enlarged, mobile, normal contour.  No CMT  Adnexa: ovaries non-enlarged, no adnexal masses  Rectal: deferred  Lymphatic: no evidence of inguinal lymphadenopathy Extremities: no edema, erythema, or tenderness Neurologic: Grossly intact Psychiatric: mood appropriate, affect full  Female chaperone present for pelvic and breast  portions of the physical exam    Assessment: 37 y.o. G1P1001 routine annual exam  Plan: Problem List Items Addressed This Visit    None      1) STI screening was not offered  2) ASCCP guidelines and rational discussed.  Patient opts for every 3 years screening interval  3) Contraception - condoms, if menstrual cycles contiue to remain irregular discussed contraceptive options for cycle control  4) Routine healthcare maintenance including cholesterol, diabetes screening discussed managed by PCP  5) Follow up 1 year for routine annual exam

## 2017-09-15 NOTE — Patient Instructions (Signed)
Preventive Care 18-39 Years, Female Preventive care refers to lifestyle choices and visits with your health care provider that can promote health and wellness. What does preventive care include?  A yearly physical exam. This is also called an annual well check.  Dental exams once or twice a year.  Routine eye exams. Ask your health care provider how often you should have your eyes checked.  Personal lifestyle choices, including: ? Daily care of your teeth and gums. ? Regular physical activity. ? Eating a healthy diet. ? Avoiding tobacco and drug use. ? Limiting alcohol use. ? Practicing safe sex. ? Taking vitamin and mineral supplements as recommended by your health care provider. What happens during an annual well check? The services and screenings done by your health care provider during your annual well check will depend on your age, overall health, lifestyle risk factors, and family history of disease. Counseling Your health care provider may ask you questions about your:  Alcohol use.  Tobacco use.  Drug use.  Emotional well-being.  Home and relationship well-being.  Sexual activity.  Eating habits.  Work and work Statistician.  Method of birth control.  Menstrual cycle.  Pregnancy history.  Screening You may have the following tests or measurements:  Height, weight, and BMI.  Diabetes screening. This is done by checking your blood sugar (glucose) after you have not eaten for a while (fasting).  Blood pressure.  Lipid and cholesterol levels. These may be checked every 5 years starting at age 66.  Skin check.  Hepatitis C blood test.  Hepatitis B blood test.  Sexually transmitted disease (STD) testing.  BRCA-related cancer screening. This may be done if you have a family history of breast, ovarian, tubal, or peritoneal cancers.  Pelvic exam and Pap test. This may be done every 3 years starting at age 40. Starting at age 59, this may be done every 5  years if you have a Pap test in combination with an HPV test.  Discuss your test results, treatment options, and if necessary, the need for more tests with your health care provider. Vaccines Your health care provider may recommend certain vaccines, such as:  Influenza vaccine. This is recommended every year.  Tetanus, diphtheria, and acellular pertussis (Tdap, Td) vaccine. You may need a Td booster every 10 years.  Varicella vaccine. You may need this if you have not been vaccinated.  HPV vaccine. If you are 69 or younger, you may need three doses over 6 months.  Measles, mumps, and rubella (MMR) vaccine. You may need at least one dose of MMR. You may also need a second dose.  Pneumococcal 13-valent conjugate (PCV13) vaccine. You may need this if you have certain conditions and were not previously vaccinated.  Pneumococcal polysaccharide (PPSV23) vaccine. You may need one or two doses if you smoke cigarettes or if you have certain conditions.  Meningococcal vaccine. One dose is recommended if you are age 27-21 years and a first-year college student living in a residence hall, or if you have one of several medical conditions. You may also need additional booster doses.  Hepatitis A vaccine. You may need this if you have certain conditions or if you travel or work in places where you may be exposed to hepatitis A.  Hepatitis B vaccine. You may need this if you have certain conditions or if you travel or work in places where you may be exposed to hepatitis B.  Haemophilus influenzae type b (Hib) vaccine. You may need this if  you have certain risk factors.  Talk to your health care provider about which screenings and vaccines you need and how often you need them. This information is not intended to replace advice given to you by your health care provider. Make sure you discuss any questions you have with your health care provider. Document Released: 10/27/2001 Document Revised: 05/20/2016  Document Reviewed: 07/02/2015 Elsevier Interactive Patient Education  [employer].

## 2017-09-16 ENCOUNTER — Encounter: Payer: Self-pay | Admitting: Obstetrics and Gynecology

## 2017-09-16 LAB — D-DIMER, QUANTITATIVE (NOT AT ARMC): D-DIMER: 0.33 mg{FEU}/L (ref 0.00–0.49)

## 2017-09-28 ENCOUNTER — Encounter: Payer: Self-pay | Admitting: Cardiovascular Disease

## 2017-09-28 ENCOUNTER — Ambulatory Visit (INDEPENDENT_AMBULATORY_CARE_PROVIDER_SITE_OTHER): Payer: 59 | Admitting: Cardiovascular Disease

## 2017-09-28 VITALS — BP 120/70 | HR 83 | Ht 68.0 in | Wt 243.5 lb

## 2017-09-28 DIAGNOSIS — R0602 Shortness of breath: Secondary | ICD-10-CM

## 2017-09-28 DIAGNOSIS — R002 Palpitations: Secondary | ICD-10-CM

## 2017-09-28 NOTE — Patient Instructions (Addendum)
Medication Instructions:  Your physician recommends that you continue on your current medications as directed. Please refer to the Current Medication list given to you today.   Labwork: none  Testing/Procedures: Your physician has requested that you have an echocardiogram. Echocardiography is a painless test that uses sound waves to create images of your heart. It provides your doctor with information about the size and shape of your heart and how well your heart's chambers and valves are working. This procedure takes approximately one hour. There are no restrictions for this procedure.  Your physician has recommended that you wear a holter monitor. Holter monitors are medical devices that record the heart's electrical activity. Doctors most often use these monitors to diagnose arrhythmias. Arrhythmias are problems with the speed or rhythm of the heartbeat. The monitor is a small, portable device. You can wear one while you do your normal daily activities. This is usually used to diagnose what is causing palpitations/syncope (passing out).    Follow-Up: Your physician recommends that you schedule a follow-up appointment as needed.    Any Other Special Instructions Will Be Listed Below (If Applicable).     If you need a refill on your cardiac medications before your next appointment, please call your pharmacy.  Echocardiogram An echocardiogram, or echocardiography, uses sound waves (ultrasound) to produce an image of your heart. The echocardiogram is simple, painless, obtained within a short period of time, and offers valuable information to your health care provider. The images from an echocardiogram can provide information such as:  Evidence of coronary artery disease (CAD).  Heart size.  Heart muscle function.  Heart valve function.  Aneurysm detection.  Evidence of a past heart attack.  Fluid buildup around the heart.  Heart muscle thickening.  Assess heart valve  function.  Tell a health care provider about:  Any allergies you have.  All medicines you are taking, including vitamins, herbs, eye drops, creams, and over-the-counter medicines.  Any problems you or family members have had with anesthetic medicines.  Any blood disorders you have.  Any surgeries you have had.  Any medical conditions you have.  Whether you are pregnant or may be pregnant. What happens before the procedure? No special preparation is needed. Eat and drink normally. What happens during the procedure?  In order to produce an image of your heart, gel will be applied to your chest and a wand-like tool (transducer) will be moved over your chest. The gel will help transmit the sound waves from the transducer. The sound waves will harmlessly bounce off your heart to allow the heart images to be captured in real-time motion. These images will then be recorded.  You may need an IV to receive a medicine that improves the quality of the pictures. What happens after the procedure? You may return to your normal schedule including diet, activities, and medicines, unless your health care provider tells you otherwise. This information is not intended to replace advice given to you by your health care provider. Make sure you discuss any questions you have with your health care provider. Document Released: 08/28/2000 Document Revised: 04/18/2016 Document Reviewed: 05/08/2013 Elsevier Interactive Patient Education  2017 [employer] Inc.  Holter Monitoring A Holter monitor is a small device that is used to detect abnormal heart rhythms. It clips to your clothing and is connected by wires to flat, sticky disks (electrodes) that attach to your chest. It is worn continuously for 24-48 hours. Follow these instructions at home:  Wear your Holter monitor at  all times, even while exercising and sleeping, for as long as directed by your health care provider.  Make sure that the Holter monitor is  safely clipped to your clothing or close to your body as recommended by your health care provider.  Do not get the monitor or wires wet.  Do not put body lotion or moisturizer on your chest.  Keep your skin clean.  Keep a diary of your daily activities, such as walking and doing chores. If you feel that your heartbeat is abnormal or that your heart is fluttering or skipping a beat: ? Record what you are doing when it happens. ? Record what time of day the symptoms occur.  Return your Holter monitor as directed by your health care provider.  Keep all follow-up visits as directed by your health care provider. This is important. Get help right away if:  You feel lightheaded or you faint.  You have trouble breathing.  You feel pain in your chest, upper arm, or jaw.  You feel sick to your stomach and your skin is pale, cool, or damp.  You heartbeat feels unusual or abnormal. This information is not intended to replace advice given to you by your health care provider. Make sure you discuss any questions you have with your health care provider. Document Released: 05/29/2004 Document Revised: 02/06/2016 Document Reviewed: 04/09/2014 Elsevier Interactive Patient Education  [employer].

## 2017-09-28 NOTE — Progress Notes (Signed)
Cardiology Office Note   Date:  09/28/2017   ID:  Dana Brock, DOB October 10, 1980, MRN [id number]  PCP:  Glori Luis, MD  Cardiologist:   Lorine Bears, MD   Chief Complaint  Patient presents with  . other    Ref by Dr. Birdie Sons for palpitations and dizziness. Meds reviewed by the pt. verbally. Pt. c/o yesterday having decreased blood pressure, palpitations and lightheaded with feeling like could pass out.       History of Present Illness: Dana Brock is a 37 y.o. female who was referred by Dr. Birdie Sons for evaluation of palpitations and shortness of breath.  She has no previous cardiac history and has no significant risk factors for heart disease except for obesity.  She is a previous smoker and quit about 7 years ago.  She also has a history of thyroid disease and was recently started on Synthroid.  Over the last few months, she has experienced intermittent palpitations described as skipping in her heart with dizziness. Initially, she went to the emergency room in September with what seems to have been vertigo.  She was referred to ENT at that time and was given prednisone with subsequent improvement in vertigo but she continued to have palpitations with associated dizziness described as lightheadedness.  She also had significant shortness of breath over the same period of time.  D-dimer was normal.  No chest pain.  No syncope. She does not drink alcohol and does not consume excessive amount of caffeine.  She drinks 1 cup of coffee daily.    Past Medical History:  Diagnosis Date  . Chickenpox   . Hashimoto's thyroiditis 08/2017  . Obesity   . Thyroid disease     Past Surgical History:  Procedure Laterality Date  . CESAREAN SECTION N/A 07/09/2015   Procedure: CESAREAN SECTION;  Surgeon: Vena Austria, MD;  Location: [facility] ORS;  Service: Obstetrics;  Laterality: N/A;  . NO PAST SURGERIES       Current Outpatient Medications  Medication Sig  Dispense Refill  . levothyroxine (SYNTHROID, LEVOTHROID) 25 MCG tablet Take 1 tablet (25 mcg total) by mouth daily before breakfast. 45 tablet 1   No current facility-administered medications for this visit.     Allergies:   Penicillin g; Penicillins; and Meloxicam    Social History:  The patient  reports that she quit smoking about 7 years ago. Her smoking use included cigarettes. She has a 1.25 pack-year smoking history. she has never used smokeless tobacco. She reports that she drinks about 0.6 oz of alcohol per week. She reports that she does not use drugs.   Family History:  The patient's family history includes ALS in her unknown relative; Breast cancer in her unknown relative; Cancer - Other in her mother; Hypertension in her unknown relative; Prostate cancer in her unknown relative.    ROS:  Please see the history of present illness.   Otherwise, review of systems are positive for none.   All other systems are reviewed and negative.    PHYSICAL EXAM: VS:  BP 120/70 (BP Location: Right Arm, Patient Position: Sitting, Cuff Size: Large)   Pulse 83   Ht 5\' 8"  (1.727 m)   Wt 243 lb 8 oz (110.5 kg)   LMP 09/07/2017   BMI 37.02 kg/m  , BMI Body mass index is 37.02 kg/m. GEN: Well nourished, well developed, in no acute distress  HEENT: normal  Neck: no JVD, carotid bruits, or masses Cardiac: RRR; no murmurs,  rubs, or gallops,no edema  Respiratory:  clear to auscultation bilaterally, normal work of breathing GI: soft, nontender, nondistended, + BS MS: no deformity or atrophy  Skin: warm and dry, no rash Neuro:  Strength and sensation are intact Psych: euthymic mood, full affect   EKG:  EKG is ordered today. The ekg ordered today demonstrates normal sinus rhythm with no significant ST or T wave changes.   Recent Labs: 11/27/2016: ALT 12 06/02/2017: BUN 11; Creatinine, Ser 1.04; Hemoglobin 14.1; Platelets 306; Potassium 4.1; Sodium 139 09/09/2017: TSH 14.22    Lipid Panel     Component Value Date/Time   CHOL 173 11/18/2015 1533   TRIG 174.0 (H) 11/18/2015 1533   HDL 37.10 (L) 11/18/2015 1533   CHOLHDL 5 11/18/2015 1533   VLDL 34.8 11/18/2015 1533   LDLCALC 101 (H) 11/18/2015 1533      Wt Readings from Last 3 Encounters:  09/28/17 243 lb 8 oz (110.5 kg)  09/15/17 248 lb (112.5 kg)  09/09/17 247 lb 9.6 oz (112.3 kg)       PAD Screen 09/28/2017  Previous PAD dx? No  Previous surgical procedure? No  Pain with walking? No  Subsides with rest? No  Feet/toe relief with dangling? No  Painful, non-healing ulcers? No  Extremities discolored? No      ASSESSMENT AND PLAN:  1.  Palpitations: Likely due to premature beats based on her description that the symptoms have persisted.  Thus, I requested a 48-hour Holter monitor. The exact etiology of her dizziness is not entirely clear but could be completely not cardiac related.  She is not orthostatic today.  2.  Exertional dyspnea: Could be due to physical deconditioning but we have to exclude cardiac etiology.  I requested an echocardiogram for evaluation.    Disposition:   FU with me as needed.   Signed,  Lorine BearsMuhammad Shuree Brossart, MD  09/28/2017 2:54 PM    [facility]

## 2017-09-30 ENCOUNTER — Other Ambulatory Visit: Payer: Self-pay

## 2017-09-30 ENCOUNTER — Ambulatory Visit (INDEPENDENT_AMBULATORY_CARE_PROVIDER_SITE_OTHER): Payer: 59

## 2017-09-30 DIAGNOSIS — R0602 Shortness of breath: Secondary | ICD-10-CM

## 2017-10-01 ENCOUNTER — Other Ambulatory Visit: Payer: Self-pay

## 2017-10-01 ENCOUNTER — Encounter: Payer: Self-pay | Admitting: Family Medicine

## 2017-10-01 ENCOUNTER — Ambulatory Visit (INDEPENDENT_AMBULATORY_CARE_PROVIDER_SITE_OTHER): Payer: 59 | Admitting: Family Medicine

## 2017-10-01 VITALS — BP 100/60 | HR 70 | Temp 98.5°F | Wt 244.2 lb

## 2017-10-01 DIAGNOSIS — Z6838 Body mass index (BMI) 38.0-38.9, adult: Secondary | ICD-10-CM | POA: Diagnosis not present

## 2017-10-01 DIAGNOSIS — J069 Acute upper respiratory infection, unspecified: Secondary | ICD-10-CM | POA: Diagnosis not present

## 2017-10-01 DIAGNOSIS — R0609 Other forms of dyspnea: Secondary | ICD-10-CM | POA: Diagnosis not present

## 2017-10-01 DIAGNOSIS — R002 Palpitations: Secondary | ICD-10-CM | POA: Diagnosis not present

## 2017-10-01 DIAGNOSIS — E6609 Other obesity due to excess calories: Secondary | ICD-10-CM | POA: Diagnosis not present

## 2017-10-01 DIAGNOSIS — R5383 Other fatigue: Secondary | ICD-10-CM | POA: Diagnosis not present

## 2017-10-01 DIAGNOSIS — E039 Hypothyroidism, unspecified: Secondary | ICD-10-CM | POA: Diagnosis not present

## 2017-10-01 DIAGNOSIS — E038 Other specified hypothyroidism: Secondary | ICD-10-CM

## 2017-10-01 MED ORDER — DOXYCYCLINE HYCLATE 100 MG PO TABS: 100.0000 mg | ORAL_TABLET | Freq: Two times a day (BID) | ORAL | 0 refills | Status: DC

## 2017-10-01 NOTE — Assessment & Plan Note (Signed)
Patient is symptomatic.  She is currently on 25 mcg of Synthroid.  She will continue this.  She will continue to follow with endocrinology.

## 2017-10-01 NOTE — Assessment & Plan Note (Signed)
Suspect related to hypothyroidism though we will obtain other lab work as outlined below.  Does have some hypersomnia.  Epworth sleepiness score of 12.  Could consider sleep study if not improving with treatment of thyroid disease.

## 2017-10-01 NOTE — Assessment & Plan Note (Signed)
Recently evaluated by cardiology.  Echo appears to be benign.  Suspect deconditioning and obesity related.  Encouraged diet and exercise.  She will follow-up with cardiology.

## 2017-10-01 NOTE — Assessment & Plan Note (Signed)
She will proceed with evaluation through cardiology.

## 2017-10-01 NOTE — Patient Instructions (Addendum)
Nice to see you. We will get lab work today. We will treat you with doxycycline for your sinus infection. If you are not improving please let us know.

## 2017-10-01 NOTE — Assessment & Plan Note (Signed)
Upper respiratory symptoms concerning for URI versus sinus infection.  Discussed option of starting on antibiotics now versus monitoring next 2 days and then starting on antibiotics if not improving.  She opted to monitor and start the doxycycline if she does not continue to improve.

## 2017-10-01 NOTE — Assessment & Plan Note (Signed)
Diet and exercise.   

## 2017-10-01 NOTE — Progress Notes (Signed)
Tommi Rumps, MD Phone: [phone]  Dana Brock is a 37 y.o. female who presents today for follow-up.  Hypothyroidism:  Recently saw endocrinology.  Had further lab work that confirmed hypothyroidism diagnosis.  She is back on Synthroid at 25 mcg daily.  She is tolerating it better than the higher dose she was previously on.  She notes no anxiety or palpitations with her current dosage.  She saw cardiology due to some dyspnea on exertion.  She is getting fitted for a Holter monitor next week due to concerns for PVCs versus other arrhythmia with her prior palpitations.  She had an echo yesterday which appears relatively unremarkable.  She had a negative d-dimer through her gynecologist.  Potentially was having some PVCs.  She does note she had some respiratory symptoms previously and was treated with a Z-Pak and her breathing did improve while she was on the Z-Pak though symptoms have recurred with sinus congestion and blowing green mucus out of her nose with intermittent drainage.  Notes maxillary sinuses bother her the most.  Symptoms have been present over the last week and have slightly improved over the last day or so.  She has been trying to exercise over the last week.  She has cut down on sugar and dairy.  She does note some chronic fatigue that is likely related to her hypothyroidism.  She does not wake up unrested.  She is tired during the day.  No apnea.  No significant snoring.  Social History   Tobacco Use  Smoking Status Former Smoker  . Packs/day: 0.25  . Years: 5.00  . Pack years: 1.25  . Types: Cigarettes  . Last attempt to quit: 09/28/2010  . Years since quitting: 7.0  Smokeless Tobacco Never Used     ROS see history of present illness  Objective  Physical Exam Vitals:   10/01/17 1551  BP: 100/60  Pulse: 70  Temp: 98.5 F (36.9 C)  SpO2: 98%    BP Readings from Last 3 Encounters:  10/01/17 100/60  09/28/17 120/70  09/15/17 104/68   Wt  Readings from Last 3 Encounters:  10/01/17 244 lb 3.2 oz (110.8 kg)  09/28/17 243 lb 8 oz (110.5 kg)  09/15/17 248 lb (112.5 kg)    Physical Exam  Constitutional: No distress.  HENT:  Head: Normocephalic and atraumatic.  Mouth/Throat: Oropharynx is clear and moist. No oropharyngeal exudate.  Normal TMs, slight maxillary sinus tenderness to percussion  Eyes: Conjunctivae are normal. Pupils are equal, round, and reactive to light.  Cardiovascular: Normal rate, regular rhythm and normal heart sounds.  Pulmonary/Chest: Effort normal and breath sounds normal.  Musculoskeletal: She exhibits no edema.  Neurological: She is alert. Gait normal.  Skin: Skin is warm and dry. She is not diaphoretic.     Assessment/Plan: Please see individual problem list.  Subclinical hypothyroidism Patient is symptomatic.  She is currently on 25 mcg of Synthroid.  She will continue this.  She will continue to follow with endocrinology.  Palpitations She will proceed with evaluation through cardiology.  URI (upper respiratory infection) Upper respiratory symptoms concerning for URI versus sinus infection.  Discussed option of starting on antibiotics now versus monitoring next 2 days and then starting on antibiotics if not improving.  She opted to monitor and start the doxycycline if she does not continue to improve.  Dyspnea on exertion Recently evaluated by cardiology.  Echo appears to be benign.  Suspect deconditioning and obesity related.  Encouraged diet and exercise.  She  will follow-up with cardiology.  Fatigue Suspect related to hypothyroidism though we will obtain other lab work as outlined below.  Does have some hypersomnia.  Epworth sleepiness score of 12.  Could consider sleep study if not improving with treatment of thyroid disease.  Obesity Diet and exercise.   Orders Placed This Encounter  Procedures  . CBC w/Diff  . Comp Met (CMET)  . Vitamin B12  . Vitamin D (25 hydroxy)    Meds  ordered this encounter  Medications  . doxycycline (VIBRA-TABS) 100 MG tablet    Sig: Take 1 tablet (100 mg total) by mouth 2 (two) times daily.    Dispense:  14 tablet    Refill:  0     Tommi Rumps, MD [place]

## 2017-10-02 LAB — CBC WITH DIFFERENTIAL/PLATELET
Basophils Absolute: 63 cells/uL (ref 0–200)
Basophils Relative: 0.7 %
Eosinophils Absolute: 684 cells/uL — ABNORMAL HIGH (ref 15–500)
Eosinophils Relative: 7.6 %
HCT: 39.4 % (ref 35.0–45.0)
HEMOGLOBIN: 13.5 g/dL (ref 11.7–15.5)
Lymphs Abs: 2943 cells/uL (ref 850–3900)
MCH: 29.9 pg (ref 27.0–33.0)
MCHC: 34.3 g/dL (ref 32.0–36.0)
MCV: 87.2 fL (ref 80.0–100.0)
MONOS PCT: 5.1 %
MPV: 10 fL (ref 7.5–12.5)
NEUTROS ABS: 4851 {cells}/uL (ref 1500–7800)
Neutrophils Relative %: 53.9 %
Platelets: 282 10*3/uL (ref 140–400)
RBC: 4.52 10*6/uL (ref 3.80–5.10)
RDW: 12 % (ref 11.0–15.0)
Total Lymphocyte: 32.7 %
WBC mixed population: 459 cells/uL (ref 200–950)
WBC: 9 10*3/uL (ref 3.8–10.8)

## 2017-10-02 LAB — COMPREHENSIVE METABOLIC PANEL
AG RATIO: 2 (calc) (ref 1.0–2.5)
ALT: 14 U/L (ref 6–29)
AST: 14 U/L (ref 10–30)
Albumin: 4.5 g/dL (ref 3.6–5.1)
Alkaline phosphatase (APISO): 42 U/L (ref 33–115)
BUN: 17 mg/dL (ref 7–25)
CO2: 26 mmol/L (ref 20–32)
Calcium: 9.2 mg/dL (ref 8.6–10.2)
Chloride: 104 mmol/L (ref 98–110)
Creat: 0.98 mg/dL (ref 0.50–1.10)
GLOBULIN: 2.3 g/dL (ref 1.9–3.7)
GLUCOSE: 100 mg/dL — AB (ref 65–99)
Potassium: 4 mmol/L (ref 3.5–5.3)
Sodium: 138 mmol/L (ref 135–146)
TOTAL PROTEIN: 6.8 g/dL (ref 6.1–8.1)
Total Bilirubin: 0.4 mg/dL (ref 0.2–1.2)

## 2017-10-02 LAB — VITAMIN D 25 HYDROXY (VIT D DEFICIENCY, FRACTURES): Vit D, 25-Hydroxy: 17 ng/mL — ABNORMAL LOW (ref 30–100)

## 2017-10-02 LAB — VITAMIN B12: Vitamin B-12: 346 pg/mL (ref 200–1100)

## 2017-10-07 ENCOUNTER — Telehealth: Payer: Self-pay | Admitting: Family Medicine

## 2017-10-07 ENCOUNTER — Ambulatory Visit (INDEPENDENT_AMBULATORY_CARE_PROVIDER_SITE_OTHER): Payer: 59

## 2017-10-07 DIAGNOSIS — E559 Vitamin D deficiency, unspecified: Secondary | ICD-10-CM

## 2017-10-07 DIAGNOSIS — R002 Palpitations: Secondary | ICD-10-CM | POA: Diagnosis not present

## 2017-10-07 MED ORDER — VITAMIN D (ERGOCALCIFEROL) 1.25 MG (50000 UNIT) PO CAPS: 50000.0000 [IU] | ORAL_CAPSULE | ORAL | 0 refills | Status: DC

## 2017-10-07 NOTE — Telephone Encounter (Signed)
Pt requesting Vitamin D medication to be called in to pharmacy.

## 2017-10-07 NOTE — Telephone Encounter (Signed)
Copied from CRM [id number]. Topic: Quick Communication - See Telephone Encounter >> Oct 07, 2017 10:00 AM Cipriano BunkerLambe, Annette S wrote: CRM for notification. See Telephone encounter for:   Pt. Is checking on Vit. D medication, has not been called in to:   [facility] [place], [place] - [address] [place] [id number] Phone: [phone] Fax: [phone]   10/07/17.

## 2017-10-07 NOTE — Telephone Encounter (Signed)
Medication not on current list. Please advise.  

## 2017-10-07 NOTE — Telephone Encounter (Signed)
Please send in rx per lab results

## 2017-10-07 NOTE — Telephone Encounter (Signed)
Patient is scheduled for follow up in march

## 2017-10-07 NOTE — Telephone Encounter (Signed)
Ordered.  Patient needs repeat in 8 weeks.  Lab ordered as well.  Thanks.

## 2017-10-15 ENCOUNTER — Ambulatory Visit
Admission: RE | Admit: 2017-10-15 | Discharge: 2017-10-15 | Disposition: A | Discharge status: Home / Self Care | Payer: 59 | Source: Ambulatory Visit | Attending: Cardiovascular Disease | Admitting: Cardiovascular Disease

## 2017-10-15 DIAGNOSIS — R002 Palpitations: Secondary | ICD-10-CM | POA: Insufficient documentation

## 2017-10-29 ENCOUNTER — Encounter: Payer: Self-pay | Admitting: Internal Medicine

## 2017-11-19 ENCOUNTER — Other Ambulatory Visit (INDEPENDENT_AMBULATORY_CARE_PROVIDER_SITE_OTHER): Payer: 59

## 2017-11-19 DIAGNOSIS — E039 Hypothyroidism, unspecified: Secondary | ICD-10-CM

## 2017-11-19 DIAGNOSIS — E038 Other specified hypothyroidism: Secondary | ICD-10-CM

## 2017-11-19 LAB — T4, FREE: Free T4: 0.64 ng/dL (ref 0.60–1.60)

## 2017-11-19 LAB — TSH: TSH: 16.4 u[IU]/mL — AB (ref 0.35–4.50)

## 2017-11-22 ENCOUNTER — Other Ambulatory Visit: Payer: Self-pay | Admitting: Internal Medicine

## 2017-11-22 ENCOUNTER — Encounter: Payer: Self-pay | Admitting: Internal Medicine

## 2017-11-22 DIAGNOSIS — E039 Hypothyroidism, unspecified: Secondary | ICD-10-CM

## 2017-11-22 MED ORDER — LEVOTHYROXINE SODIUM 75 MCG PO TABS: 75.0000 ug | ORAL_TABLET | Freq: Every day | ORAL | 3 refills | Status: DC

## 2017-11-29 ENCOUNTER — Encounter: Payer: Self-pay | Admitting: Family Medicine

## 2017-12-06 ENCOUNTER — Other Ambulatory Visit: Payer: Self-pay | Admitting: Family Medicine

## 2017-12-06 ENCOUNTER — Encounter: Payer: Self-pay | Admitting: Internal Medicine

## 2017-12-07 ENCOUNTER — Other Ambulatory Visit: Payer: Self-pay | Admitting: Internal Medicine

## 2017-12-07 MED ORDER — LEVOTHYROXINE SODIUM 50 MCG PO TABS: 50.0000 ug | ORAL_TABLET | Freq: Every day | ORAL | 5 refills | Status: DC

## 2017-12-24 ENCOUNTER — Encounter: Payer: Self-pay | Admitting: Family Medicine

## 2017-12-24 ENCOUNTER — Ambulatory Visit (INDEPENDENT_AMBULATORY_CARE_PROVIDER_SITE_OTHER): Payer: 59 | Admitting: Family Medicine

## 2017-12-24 VITALS — BP 118/80 | HR 68 | Temp 98.4°F | Resp 17 | Ht 67.2 in | Wt 237.5 lb

## 2017-12-24 DIAGNOSIS — R300 Dysuria: Secondary | ICD-10-CM | POA: Insufficient documentation

## 2017-12-24 DIAGNOSIS — M5416 Radiculopathy, lumbar region: Secondary | ICD-10-CM | POA: Insufficient documentation

## 2017-12-24 DIAGNOSIS — G8929 Other chronic pain: Secondary | ICD-10-CM | POA: Diagnosis not present

## 2017-12-24 DIAGNOSIS — R1011 Right upper quadrant pain: Secondary | ICD-10-CM

## 2017-12-24 DIAGNOSIS — M5412 Radiculopathy, cervical region: Secondary | ICD-10-CM | POA: Diagnosis not present

## 2017-12-24 DIAGNOSIS — Z13220 Encounter for screening for lipoid disorders: Secondary | ICD-10-CM | POA: Diagnosis not present

## 2017-12-24 DIAGNOSIS — Z3202 Encounter for pregnancy test, result negative: Secondary | ICD-10-CM | POA: Diagnosis not present

## 2017-12-24 DIAGNOSIS — E669 Obesity, unspecified: Secondary | ICD-10-CM

## 2017-12-24 DIAGNOSIS — Z0001 Encounter for general adult medical examination with abnormal findings: Secondary | ICD-10-CM

## 2017-12-24 DIAGNOSIS — G44209 Tension-type headache, unspecified, not intractable: Secondary | ICD-10-CM | POA: Insufficient documentation

## 2017-12-24 LAB — POCT URINE PREGNANCY: Preg Test, Ur: NEGATIVE

## 2017-12-24 LAB — POCT URINALYSIS DIPSTICK
Bilirubin, UA: NEGATIVE
Glucose, UA: NEGATIVE
KETONES UA: NEGATIVE
Nitrite, UA: NEGATIVE
PH UA: 6 (ref 5.0–8.0)
Protein, UA: NEGATIVE
Spec Grav, UA: <=1.005 — AB (ref 1.010–1.025)
UROBILINOGEN UA: 0.2 U/dL

## 2017-12-24 NOTE — Assessment & Plan Note (Signed)
We will check a urinalysis. 
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
## 2017-12-24 NOTE — Assessment & Plan Note (Signed)
Physical exam completed.  She will continue to follow with gynecology.  Encouraged continued diet and exercise.  She will return for fasting labs.

## 2017-12-24 NOTE — Assessment & Plan Note (Addendum)
Patient with symptoms concerning for nerve impingement in her low back.  Neurologically at baseline.  She will have an x-ray of her lumbar spine.  Discussed return precautions.

## 2017-12-24 NOTE — Patient Instructions (Signed)
Nice to see you. Please continue to work on diet and exercise. We will refer you to GI for your chronic right upper quadrant pain. Please get the x-rays done as we discussed.  We will contact you when they come back.  Please monitor your headaches and if they worsen please be reevaluated. If you develop new numbness or weakness or worsening headaches please seek medical attention.
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
## 2017-12-24 NOTE — Assessment & Plan Note (Signed)
She continues to have issues with this.  Potentially could be gallbladder related though prior ultrasound with no obvious cause.  Could be gastritis or peptic ulcer disease.  Discussed placing her on a PPI though she deferred given that we will be referring her to GI.

## 2017-12-25 DIAGNOSIS — M7061 Trochanteric bursitis, right hip: Secondary | ICD-10-CM | POA: Diagnosis not present

## 2017-12-25 LAB — URINE CULTURE
MICRO NUMBER:: 90453703
SPECIMEN QUALITY:: ADEQUATE

## 2017-12-25 LAB — URINALYSIS, MICROSCOPIC ONLY
Bacteria, UA: NONE SEEN /HPF
HYALINE CAST: NONE SEEN /LPF
RBC / HPF: NONE SEEN /HPF (ref 0–2)

## 2017-12-28 ENCOUNTER — Ambulatory Visit
Admission: RE | Admit: 2017-12-28 | Discharge: 2017-12-28 | Disposition: A | Discharge status: Home / Self Care | Payer: 59 | Source: Ambulatory Visit | Attending: Family Medicine | Admitting: Family Medicine

## 2017-12-28 DIAGNOSIS — M5412 Radiculopathy, cervical region: Secondary | ICD-10-CM | POA: Diagnosis not present

## 2017-12-28 DIAGNOSIS — M542 Cervicalgia: Secondary | ICD-10-CM | POA: Diagnosis not present

## 2017-12-28 DIAGNOSIS — M545 Low back pain: Secondary | ICD-10-CM | POA: Insufficient documentation

## 2017-12-28 DIAGNOSIS — M5416 Radiculopathy, lumbar region: Secondary | ICD-10-CM | POA: Insufficient documentation

## 2017-12-28 DIAGNOSIS — R200 Anesthesia of skin: Secondary | ICD-10-CM | POA: Diagnosis not present

## 2018-01-03 ENCOUNTER — Other Ambulatory Visit (INDEPENDENT_AMBULATORY_CARE_PROVIDER_SITE_OTHER): Payer: 59

## 2018-01-03 DIAGNOSIS — E039 Hypothyroidism, unspecified: Secondary | ICD-10-CM

## 2018-01-03 DIAGNOSIS — E559 Vitamin D deficiency, unspecified: Secondary | ICD-10-CM

## 2018-01-03 DIAGNOSIS — E669 Obesity, unspecified: Secondary | ICD-10-CM | POA: Diagnosis not present

## 2018-01-03 DIAGNOSIS — Z13220 Encounter for screening for lipoid disorders: Secondary | ICD-10-CM

## 2018-01-03 LAB — LIPID PANEL
CHOL/HDL RATIO: 4
Cholesterol: 160 mg/dL (ref 0–200)
HDL: 40.1 mg/dL (ref >39.00)
LDL Cholesterol: 104 mg/dL — ABNORMAL HIGH (ref 0–99)
NonHDL: 120.11
TRIGLYCERIDES: 83 mg/dL (ref 0.0–149.0)
VLDL: 16.6 mg/dL (ref 0.0–40.0)

## 2018-01-03 LAB — COMPREHENSIVE METABOLIC PANEL
ALT: 13 U/L (ref 0–35)
AST: 13 U/L (ref 0–37)
Albumin: 4.2 g/dL (ref 3.5–5.2)
Alkaline Phosphatase: 47 U/L (ref 39–117)
BILIRUBIN TOTAL: 0.7 mg/dL (ref 0.2–1.2)
BUN: 14 mg/dL (ref 6–23)
CO2: 26 meq/L (ref 19–32)
Calcium: 9.4 mg/dL (ref 8.4–10.5)
Chloride: 104 mEq/L (ref 96–112)
Creatinine, Ser: 0.82 mg/dL (ref 0.40–1.20)
GFR: 84.55 mL/min (ref >60.00)
Glucose, Bld: 84 mg/dL (ref 70–99)
Potassium: 4 mEq/L (ref 3.5–5.1)
Sodium: 139 mEq/L (ref 135–145)
Total Protein: 7.2 g/dL (ref 6.0–8.3)

## 2018-01-03 LAB — VITAMIN D 25 HYDROXY (VIT D DEFICIENCY, FRACTURES): VITD: 25.71 ng/mL — ABNORMAL LOW (ref 30.00–100.00)

## 2018-01-03 LAB — HEMOGLOBIN A1C: Hgb A1c MFr Bld: 5.5 % (ref 4.6–6.5)

## 2018-01-06 ENCOUNTER — Encounter (INDEPENDENT_AMBULATORY_CARE_PROVIDER_SITE_OTHER): Payer: 59

## 2018-01-06 ENCOUNTER — Encounter: Payer: Self-pay | Admitting: Family Medicine

## 2018-01-14 DIAGNOSIS — H5202 Hypermetropia, left eye: Secondary | ICD-10-CM | POA: Diagnosis not present

## 2018-01-14 DIAGNOSIS — H52223 Regular astigmatism, bilateral: Secondary | ICD-10-CM | POA: Diagnosis not present

## 2018-01-14 DIAGNOSIS — H5211 Myopia, right eye: Secondary | ICD-10-CM | POA: Diagnosis not present

## 2018-01-19 ENCOUNTER — Telehealth: Payer: Self-pay | Admitting: Internal Medicine

## 2018-01-19 ENCOUNTER — Encounter (INDEPENDENT_AMBULATORY_CARE_PROVIDER_SITE_OTHER): Payer: Self-pay | Admitting: Family Medicine

## 2018-01-19 ENCOUNTER — Ambulatory Visit (INDEPENDENT_AMBULATORY_CARE_PROVIDER_SITE_OTHER): Payer: 59 | Admitting: Family Medicine

## 2018-01-19 VITALS — BP 107/71 | HR 61 | Temp 98.1°F | Ht 67.0 in | Wt 231.0 lb

## 2018-01-19 DIAGNOSIS — Z6836 Body mass index (BMI) 36.0-36.9, adult: Secondary | ICD-10-CM

## 2018-01-19 DIAGNOSIS — R5383 Other fatigue: Secondary | ICD-10-CM | POA: Diagnosis not present

## 2018-01-19 DIAGNOSIS — Z9189 Other specified personal risk factors, not elsewhere classified: Secondary | ICD-10-CM | POA: Diagnosis not present

## 2018-01-19 DIAGNOSIS — Z1331 Encounter for screening for depression: Secondary | ICD-10-CM

## 2018-01-19 DIAGNOSIS — R0602 Shortness of breath: Secondary | ICD-10-CM | POA: Diagnosis not present

## 2018-01-19 DIAGNOSIS — E038 Other specified hypothyroidism: Secondary | ICD-10-CM | POA: Diagnosis not present

## 2018-01-19 DIAGNOSIS — Z0289 Encounter for other administrative examinations: Secondary | ICD-10-CM

## 2018-01-19 DIAGNOSIS — E559 Vitamin D deficiency, unspecified: Secondary | ICD-10-CM

## 2018-01-19 DIAGNOSIS — E039 Hypothyroidism, unspecified: Secondary | ICD-10-CM

## 2018-01-19 DIAGNOSIS — E162 Hypoglycemia, unspecified: Secondary | ICD-10-CM

## 2018-01-19 MED ORDER — VITAMIN D (ERGOCALCIFEROL) 1.25 MG (50000 UNIT) PO CAPS: 50000.0000 [IU] | ORAL_CAPSULE | ORAL | 0 refills | Status: DC

## 2018-01-19 NOTE — Telephone Encounter (Signed)
I called pharmacy- pt has refills on file. I called pt and left her a detailed mess informing her to check with [facility] pharmacy for refills and to call our office back to schedule a lab appointment. Lab orders placed.

## 2018-01-19 NOTE — Telephone Encounter (Signed)
Ok, let's have her back for a TSH and free t4. Can you put these in? Ty!

## 2018-01-19 NOTE — Telephone Encounter (Signed)
levothyroxine (SYNTHROID, LEVOTHROID) 50 MCG tablet    [facility] - [place], [place] - [address]   Patient stated she needs a refill on the prescription. She also stated she needed to come back in to have blood work done but I looked and  there are no orders in. I let the patient know I  would send a message back to have those put in and someone would give her a call back to get her schedule for lab work  Please advise

## 2018-01-20 LAB — COMPREHENSIVE METABOLIC PANEL
ALBUMIN: 4.5 g/dL (ref 3.5–5.5)
ALT: 12 IU/L (ref 0–32)
AST: 13 IU/L (ref 0–40)
Albumin/Globulin Ratio: 1.8 (ref 1.2–2.2)
Alkaline Phosphatase: 62 IU/L (ref 39–117)
BUN / CREAT RATIO: 15 (ref 9–23)
BUN: 13 mg/dL (ref 6–20)
Bilirubin Total: 0.4 mg/dL (ref 0.0–1.2)
CO2: 23 mmol/L (ref 20–29)
CREATININE: 0.84 mg/dL (ref 0.57–1.00)
Calcium: 9.1 mg/dL (ref 8.7–10.2)
Chloride: 103 mmol/L (ref 96–106)
GFR calc non Af Amer: 91 mL/min/{1.73_m2} (ref >59)
GFR, EST AFRICAN AMERICAN: 105 mL/min/{1.73_m2} (ref >59)
GLOBULIN, TOTAL: 2.5 g/dL (ref 1.5–4.5)
GLUCOSE: 87 mg/dL (ref 65–99)
Potassium: 4.4 mmol/L (ref 3.5–5.2)
SODIUM: 140 mmol/L (ref 134–144)
TOTAL PROTEIN: 7 g/dL (ref 6.0–8.5)

## 2018-01-20 LAB — CBC WITH DIFFERENTIAL
BASOS ABS: 0 10*3/uL (ref 0.0–0.2)
Basos: 0 %
EOS (ABSOLUTE): 0.4 10*3/uL (ref 0.0–0.4)
EOS: 6 %
HEMATOCRIT: 42 % (ref 34.0–46.6)
HEMOGLOBIN: 14.1 g/dL (ref 11.1–15.9)
IMMATURE GRANS (ABS): 0 10*3/uL (ref 0.0–0.1)
Immature Granulocytes: 0 %
LYMPHS ABS: 1.7 10*3/uL (ref 0.7–3.1)
Lymphs: 23 %
MCH: 30.7 pg (ref 26.6–33.0)
MCHC: 33.6 g/dL (ref 31.5–35.7)
MCV: 91 fL (ref 79–97)
MONOCYTES: 5 %
Monocytes Absolute: 0.4 10*3/uL (ref 0.1–0.9)
Neutrophils Absolute: 4.7 10*3/uL (ref 1.4–7.0)
Neutrophils: 66 %
RBC: 4.6 x10E6/uL (ref 3.77–5.28)
RDW: 13.4 % (ref 12.3–15.4)
WBC: 7.2 10*3/uL (ref 3.4–10.8)

## 2018-01-20 LAB — VITAMIN B12: Vitamin B-12: 302 pg/mL (ref 232–1245)

## 2018-01-20 LAB — FOLATE: Folate: 11.5 ng/mL (ref >3.0)

## 2018-01-20 LAB — INSULIN, RANDOM: INSULIN: 14.4 u[IU]/mL (ref 2.6–24.9)

## 2018-01-25 NOTE — Progress Notes (Signed)
Office: [phone]  /  Fax: [phone]   Dear Dr. Birdie Brock,   Thank you for referring Dana Brock to our clinic. The following note includes my evaluation and treatment recommendations.  HPI:   Chief Complaint: OBESITY    Dana Brock has been referred by Dana Brock. Dana Sons, MD for consultation regarding her obesity and obesity related comorbidities.    Dana Brock (MR# [id number]) is a 37 y.o. female who presents on 01/19/2018 for obesity evaluation and treatment. Current BMI is Body mass index is 36.18 kg/m.Marland Kitchen Dana Brock has been struggling with her weight for many years and has been unsuccessful in either losing weight, maintaining weight loss, or reaching her healthy weight goal.     Dana Brock attended our information session and states she is currently in the action stage of change and ready to dedicate time achieving and maintaining a healthier weight. Dana Brock is interested in becoming our patient and working on intensive lifestyle modifications including (but not limited to) diet, exercise and weight loss.    Dana Brock states her family eats meals together she thinks her family will eat healthier with  her her desired weight loss is 66 lbs she has been heavy most of  her life she started gaining weight in late 20's her heaviest weight ever was 255 lbs she has significant food cravings issues  she is trying to eat vegetarian she is trying to eat vegan she is frequently drinking liquids with calories she frequently makes poor food choices she has problems with excessive hunger  she frequently eats larger portions than normal  she struggles with emotional eating    Fatigue Dana Brock feels her energy is lower than it should be. This has worsened with weight gain and has not worsened recently. Dana Brock admits to daytime somnolence and  denies waking up still tired. Patient is at risk for obstructive sleep apnea. Patent has a history of symptoms  of daytime fatigue. Patient generally gets 6 hours of sleep per night, and states they generally have generally restful sleep. Snoring is not present. Apneic episodes are present. Epworth Sleepiness Score is 9.  Dyspnea on exertion Dana Brock notes increasing shortness of breath with exercising and seems to be worsening over time with weight gain. She notes getting out of breath sooner with activity than she used to. This has not gotten worse recently. Dana Brock denies orthopnea.  Hypothyroidism Dana Brock has a diagnosis of hypothyroidism. She is on levothyroxine 50 mcg now (felt over-replaced and tachycardic on 75 mcg). She is getting labs next week by her Endocrinologist. She was told to decrease dairy and gluten by Dr. Lafe Brock. She notes hot or cold intolerance, palpitations, and ongoing fatigue.  At risk for cardiovascular disease Dana Brock is at a higher than average risk for cardiovascular disease due to obesity and hypothyroidism. She currently denies any chest pain.  Vitamin D Deficiency Dana Brock has a diagnosis of vitamin D deficiency. Level not yet at goal, she was on Vit D prescription but no longer. She denies nausea, vomiting or muscle weakness.  Hypoglycemia Dana Brock notes feeling hypoglycemic especially in the afternoons at work, sometimes feels like she is going to pass out if she doesn't eat. Last A1c was 5.5.  Depression Screen Dana Brock's Food and Mood (modified PHQ-9) score was  Depression screen PHQ 2/9 01/19/2018  Decreased Interest 1  Down, Depressed, Hopeless 1  PHQ - 2 Score 2  Altered sleeping 1  Tired, decreased energy 1  Change in appetite 1  Feeling bad or failure about  yourself  1  Trouble concentrating 1  Moving slowly or fidgety/restless 0  Suicidal thoughts 0  PHQ-9 Score 7  Difficult doing work/chores Not difficult at all    ALLERGIES: Allergies  Allergen Reactions  . Penicillin G Hives  . Penicillins Hives  . Meloxicam   . Pineapple      MEDICATIONS: Current Outpatient Medications on File Prior to Visit  Medication Sig Dispense Refill  . levothyroxine (SYNTHROID, LEVOTHROID) 50 MCG tablet Take 1 tablet (50 mcg total) by mouth daily before breakfast. 45 tablet 5   No current facility-administered medications on file prior to visit.     PAST MEDICAL HISTORY: Past Medical History:  Diagnosis Date  . Bursitis    Right Hip  . Chickenpox   . Chronic headaches   . Chronic low back pain   . Chronic neck pain   . Fatty liver   . Hashimoto's thyroiditis 08/2017  . Obesity   . Thyroid disease   . Vitamin D deficiency     PAST SURGICAL HISTORY: Past Surgical History:  Procedure Laterality Date  . CESAREAN SECTION N/A 07/09/2015   Procedure: CESAREAN SECTION;  Surgeon: Vena Austria, MD;  Location: [facility] ORS;  Service: Obstetrics;  Laterality: N/A;  . NO PAST SURGERIES      SOCIAL HISTORY: Social History   Tobacco Use  . Smoking status: Former Smoker    Packs/day: 0.25    Years: 5.00    Pack years: 1.25    Types: Cigarettes    Last attempt to quit: 09/28/2010    Years since quitting: 7.3  . Smokeless tobacco: Never Used  Substance Use Topics  . Alcohol use: Yes    Alcohol/week: 0.6 oz    Types: 1 Standard drinks or equivalent per week  . Drug use: No    FAMILY HISTORY: Family History  Problem Relation Age of Onset  . Cancer - Other Mother   . Thyroid disease Mother   . Breast cancer Unknown        Grandmother  . Prostate cancer Unknown        Grandfather  . Hypertension Unknown        Parent  . ALS Unknown        Grandparent  . Hypertension Father   . Hyperlipidemia Father   . Thyroid disease Father     ROS: Review of Systems  Constitutional: Positive for malaise/fatigue. Negative for weight loss.  HENT: Positive for tinnitus.        + Decreased hearing  Eyes: Positive for redness.       + Wear glasses or contacts  Respiratory: Positive for shortness of breath (with exertion).    Cardiovascular: Positive for palpitations. Negative for chest pain and orthopnea.       + Leg cramping + Very cold feet or hands (sometimes)  Gastrointestinal: Positive for heartburn and nausea. Negative for vomiting.  Musculoskeletal: Positive for back pain.       Negative muscle weakness + Muscle or joint pain  Neurological: Positive for dizziness and headaches.  Endo/Heme/Allergies:       + Heat/cold intolerance  Psychiatric/Behavioral: The patient is nervous/anxious (anxiety).        + Stress    PHYSICAL EXAM: Blood pressure 107/71, pulse 61, temperature 98.1 F (36.7 C), temperature source Oral, height  (1.702 m), weight 231 lb (104.8 kg), last menstrual period 01/15/2018, SpO2 98 %. Body mass index is 36.18 kg/m. Physical Exam  Constitutional: She is oriented to person,  place, and time. She appears well-developed and well-nourished.  HENT:  Head: Normocephalic and atraumatic.  Nose: Nose normal.  Eyes: EOM are normal. No scleral icterus.  Neck: Normal range of motion. Neck supple. No thyromegaly present.  Cardiovascular: Normal rate and regular rhythm.  Pulmonary/Chest: Effort normal. No respiratory distress.  Abdominal: Soft. There is no tenderness.  + Obesity  Musculoskeletal:  Range of Motion normal in all 4 extremities Trace edema noted in bilateral lower extremities  Neurological: She is alert and oriented to person, place, and time. Coordination normal.  Skin: Skin is warm and dry.  Psychiatric: She has a normal mood and affect. Her behavior is normal.  Vitals reviewed.   RECENT LABS AND TESTS: BMET    Component Value Date/Time   NA 140 01/19/2018 0905   K 4.4 01/19/2018 0905   CL 103 01/19/2018 0905   CO2 23 01/19/2018 0905   GLUCOSE 87 01/19/2018 0905   GLUCOSE 84 01/03/2018 1011   BUN 13 01/19/2018 0905   CREATININE 0.84 01/19/2018 0905   CREATININE 0.98 10/01/2017 1637   CALCIUM 9.1 01/19/2018 0905   GFRNONAA 91 01/19/2018 0905   GFRAA 105  01/19/2018 0905   Lab Results  Component Value Date   HGBA1C 5.5 01/03/2018   Lab Results  Component Value Date   INSULIN 14.4 01/19/2018   CBC    Component Value Date/Time   WBC 7.2 01/19/2018 0905   WBC 9.0 10/01/2017 1637   RBC 4.60 01/19/2018 0905   RBC 4.52 10/01/2017 1637   HGB 14.1 01/19/2018 0905   HCT 42.0 01/19/2018 0905   PLT 282 10/01/2017 1637   MCV 91 01/19/2018 0905   MCH 30.7 01/19/2018 0905   MCH 29.9 10/01/2017 1637   MCHC 33.6 01/19/2018 0905   MCHC 34.3 10/01/2017 1637   RDW 13.4 01/19/2018 0905   LYMPHSABS 1.7 01/19/2018 0905   EOSABS 0.4 01/19/2018 0905   BASOSABS 0.0 01/19/2018 0905   Iron/TIBC/Ferritin/ %Sat No results found for: IRON, TIBC, FERRITIN, IRONPCTSAT Lipid Panel     Component Value Date/Time   CHOL 160 01/03/2018 1011   TRIG 83.0 01/03/2018 1011   HDL 40.10 01/03/2018 1011   CHOLHDL 4 01/03/2018 1011   VLDL 16.6 01/03/2018 1011   LDLCALC 104 (H) 01/03/2018 1011   Hepatic Function Panel     Component Value Date/Time   PROT 7.0 01/19/2018 0905   ALBUMIN 4.5 01/19/2018 0905   AST 13 01/19/2018 0905   ALT 12 01/19/2018 0905   ALKPHOS 62 01/19/2018 0905   BILITOT 0.4 01/19/2018 0905      Component Value Date/Time   TSH 16.40 (H) 11/19/2017 1537   TSH 14.22 (H) 09/09/2017 0900   TSH 4.141 06/02/2017 1615   TSH 2.67 04/23/2017 1030    ECG  shows NSR with a rate of 68 BPM INDIRECT CALORIMETER done today shows a VO2 of 319 and a REE of 2220.  Her calculated basal metabolic rate is 1610 thus her basal metabolic rate is better than expected.    ASSESSMENT AND PLAN: Other fatigue - Plan: EKG 12-Lead, CBC With Differential, Vitamin B12, Folate  Shortness of breath on exertion - Plan: CBC With Differential  Other specified hypothyroidism  Vitamin D deficiency - Plan: Vitamin D, Ergocalciferol, (DRISDOL) 50000 units CAPS capsule  Hypoglycemia - Plan: Comprehensive metabolic panel, Insulin, random  Depression  screening  At risk for heart disease  Class 2 severe obesity with serious comorbidity and body mass index (BMI) of 36.0 to  36.9 in adult, unspecified obesity type [facility])  PLAN:  Fatigue Dana Brock was informed that her fatigue may be related to obesity, depression or many other causes. Labs will be ordered, and in the meanwhile Ivadell has agreed to work on diet, exercise and weight loss to help with fatigue. Proper sleep hygiene was discussed including the need for 7-8 hours of quality sleep each night. A sleep study was not ordered based on symptoms and Epworth score.  Dyspnea on exertion Dana Brock's shortness of breath appears to be obesity related and exercise induced. She has agreed to work on weight loss and gradually increase exercise to treat her exercise induced shortness of breath. If Cincere follows our instructions and loses weight without improvement of her shortness of breath, we will plan to refer to pulmonology. We will monitor this condition regularly. Dana Brock agrees to this plan.  Hypothyroidism Dana Brock was informed of the importance of good thyroid control to help with weight loss efforts. She was also informed that supertheraputic thyroid levels are dangerous and will not improve weight loss results. Dana Brock agrees to continue taking levothyroxine, and will follow up with labs. Dana Brock agrees to follow up with our clinic in 2 weeks.  Cardiovascular risk counselling Dana Brock was given extended (15 minutes) coronary artery disease prevention counseling today. She is 37 y.o. female and has risk factors for heart disease including obesity and hypothyroidism. We discussed intensive lifestyle modifications today with an emphasis on specific weight loss instructions and strategies. Pt was also informed of the importance of increasing exercise and decreasing saturated fats to help prevent heart disease.  Vitamin D Deficiency Dana Brock was informed that low vitamin D  levels contributes to fatigue and are associated with obesity, breast, and colon cancer. Dana Brock agrees to restart prescription Vit D ,000 IU every week #4 with no refills. She will follow up for routine testing of vitamin D, at least 2-3 times per year. She was informed of the risk of over-replacement of vitamin D and agrees to not increase her dose unless she discusses this with [place] first. Dana Brock agrees to follow up with our clinic in 2 weeks.  Hypoglycemia Labs will be checked and Raphaela is to start diet prescription to help stabilize glucose levels. Jaleen agrees to follow up with our clinic in 2 weeks.  Depression Screen Noni had a mildly positive depression screening. Depression is commonly associated with obesity and often results in emotional eating behaviors. We will monitor this closely and work on CBT to help improve the non-hunger eating patterns. Referral to Psychology may be required if no improvement is seen as she continues in our clinic.  Obesity Hajar is currently in the action stage of change and her goal is to continue with weight loss efforts. I recommend Elna begin the structured treatment plan as follows:  She has agreed to follow the Category 3 plan Artemisia has been instructed to eventually work up to a goal of 150 minutes of combined cardio and strengthening exercise per week for weight loss and overall health benefits. We discussed the following Behavioral Modification Strategies today: increasing lean protein intake, decreasing simple carbohydrates  and work on meal planning and easy cooking plans   She was informed of the importance of frequent follow up visits to maximize her success with intensive lifestyle modifications for her multiple health conditions. She was informed we would discuss her lab results at her next visit unless there is a critical issue that needs to be addressed sooner. Selenne agreed to keep her  next visit at the agreed  upon time to discuss these results.    OBESITY BEHAVIORAL INTERVENTION VISIT  Today's visit was # 1 out of 22.  Starting weight: 231 lbs Starting date: 01/19/18 Today's weight : 231 lbs  Today's date: 01/19/2018 Total lbs lost to date: 0 (Patients must lose 7 lbs in the first 6 months to continue with counseling)   ASK: We discussed the diagnosis of obesity with Kingsley Plan today and Tylan agreed to give [place] permission to discuss obesity behavioral modification therapy today.  ASSESS: Ahna has the diagnosis of obesity and her BMI today is 36.17 Ixel is in the action stage of change   ADVISE: Leshae was educated on the multiple health risks of obesity as well as the benefit of weight loss to improve her health. She was advised of the need for long term treatment and the importance of lifestyle modifications.  AGREE: Multiple dietary modification options and treatment options were discussed and  [place] agreed to the above obesity treatment plan.   I, Burt Knack, am acting as transcriptionist for Quillian Quince, MD  I have reviewed the above documentation for accuracy and completeness, and I agree with the above. -Quillian Quince, MD

## 2018-01-28 ENCOUNTER — Other Ambulatory Visit: Payer: 59

## 2018-01-28 DIAGNOSIS — E039 Hypothyroidism, unspecified: Secondary | ICD-10-CM

## 2018-01-28 LAB — TSH: TSH: 6.05 u[IU]/mL — ABNORMAL HIGH (ref 0.35–4.50)

## 2018-01-28 LAB — T4, FREE: FREE T4: 0.77 ng/dL (ref 0.60–1.60)

## 2018-02-01 ENCOUNTER — Emergency Department
Admission: EM | Admit: 2018-02-01 | Discharge: 2018-02-01 | Disposition: A | Discharge status: Home / Self Care | Payer: No Typology Code available for payment source | Attending: Emergency Medicine | Admitting: Emergency Medicine

## 2018-02-01 ENCOUNTER — Other Ambulatory Visit: Payer: Self-pay

## 2018-02-01 ENCOUNTER — Emergency Department: Payer: No Typology Code available for payment source

## 2018-02-01 DIAGNOSIS — R51 Headache: Secondary | ICD-10-CM | POA: Diagnosis not present

## 2018-02-01 DIAGNOSIS — Y9389 Activity, other specified: Secondary | ICD-10-CM | POA: Diagnosis not present

## 2018-02-01 DIAGNOSIS — Y999 Unspecified external cause status: Secondary | ICD-10-CM | POA: Diagnosis not present

## 2018-02-01 DIAGNOSIS — S0101XA Laceration without foreign body of scalp, initial encounter: Secondary | ICD-10-CM | POA: Insufficient documentation

## 2018-02-01 DIAGNOSIS — Z87891 Personal history of nicotine dependence: Secondary | ICD-10-CM | POA: Diagnosis not present

## 2018-02-01 DIAGNOSIS — Z79899 Other long term (current) drug therapy: Secondary | ICD-10-CM | POA: Insufficient documentation

## 2018-02-01 DIAGNOSIS — Y92410 Unspecified street and highway as the place of occurrence of the external cause: Secondary | ICD-10-CM | POA: Diagnosis not present

## 2018-02-01 DIAGNOSIS — S0990XA Unspecified injury of head, initial encounter: Secondary | ICD-10-CM

## 2018-02-01 DIAGNOSIS — S161XXA Strain of muscle, fascia and tendon at neck level, initial encounter: Secondary | ICD-10-CM | POA: Diagnosis not present

## 2018-02-01 DIAGNOSIS — S199XXA Unspecified injury of neck, initial encounter: Secondary | ICD-10-CM | POA: Diagnosis not present

## 2018-02-01 DIAGNOSIS — E063 Autoimmune thyroiditis: Secondary | ICD-10-CM | POA: Diagnosis not present

## 2018-02-01 DIAGNOSIS — Z23 Encounter for immunization: Secondary | ICD-10-CM | POA: Diagnosis not present

## 2018-02-01 MED ORDER — LIDOCAINE HCL (PF) 1 % IJ SOLN
INTRAMUSCULAR | Status: AC
  Filled 2018-02-01: qty 5

## 2018-02-01 MED ORDER — LIDOCAINE-EPINEPHRINE-TETRACAINE (LET) SOLUTION
3.0000 mL | Freq: Once | NASAL | Status: AC
  Administered 2018-02-01: 3 mL via TOPICAL
  Filled 2018-02-01: qty 3

## 2018-02-01 MED ORDER — BACITRACIN ZINC 500 UNIT/GM EX OINT
TOPICAL_OINTMENT | CUTANEOUS | Status: AC
  Administered 2018-02-01: 1
  Filled 2018-02-01: qty 1.8

## 2018-02-01 MED ORDER — BACLOFEN 10 MG PO TABS: 10.0000 mg | ORAL_TABLET | Freq: Every day | ORAL | 1 refills | Status: DC

## 2018-02-01 MED ORDER — LIDOCAINE HCL (PF) 1 % IJ SOLN
5.0000 mL | Freq: Once | INTRAMUSCULAR | Status: AC
  Administered 2018-02-01: 5 mL via INTRADERMAL
  Filled 2018-02-01: qty 5

## 2018-02-01 MED ORDER — IBUPROFEN 600 MG PO TABS
600.0000 mg | ORAL_TABLET | Freq: Once | ORAL | Status: AC
  Administered 2018-02-01: 600 mg via ORAL
  Filled 2018-02-01: qty 1

## 2018-02-01 MED ORDER — TETANUS-DIPHTH-ACELL PERTUSSIS 5-2.5-18.5 LF-MCG/0.5 IM SUSP
0.5000 mL | Freq: Once | INTRAMUSCULAR | Status: AC
  Administered 2018-02-01: 0.5 mL via INTRAMUSCULAR
  Filled 2018-02-01: qty 0.5

## 2018-02-01 MED ORDER — IBUPROFEN 800 MG PO TABS: 800.0000 mg | ORAL_TABLET | Freq: Three times a day (TID) | ORAL | 0 refills | Status: DC | PRN

## 2018-02-01 MED ORDER — CLINDAMYCIN HCL 300 MG PO CAPS: 300.0000 mg | ORAL_CAPSULE | Freq: Three times a day (TID) | ORAL | 0 refills | Status: DC
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
## 2018-02-01 NOTE — ED Notes (Signed)
Patient ambulatory to lobby with steady gait and NAD noted. Verbalized understanding of discharge instructions and follow-up care.  

## 2018-02-01 NOTE — Discharge Instructions (Addendum)
Follow-up with your regular doctor or the acute care if you are not better in 5 to 7 days.  Return to the emergency department or see your regular doctor for staple removal in 10 days.  Apply ice to all areas that hurt.  Use medication as prescribed.  If you are worsening please return to emergency department
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
## 2018-02-01 NOTE — ED Notes (Signed)
See triage note.

## 2018-02-02 ENCOUNTER — Ambulatory Visit (INDEPENDENT_AMBULATORY_CARE_PROVIDER_SITE_OTHER): Payer: 59 | Admitting: Family Medicine

## 2018-02-02 ENCOUNTER — Encounter (INDEPENDENT_AMBULATORY_CARE_PROVIDER_SITE_OTHER): Payer: Self-pay

## 2018-02-11 ENCOUNTER — Encounter: Payer: Self-pay | Admitting: Family Medicine

## 2018-02-11 ENCOUNTER — Ambulatory Visit (INDEPENDENT_AMBULATORY_CARE_PROVIDER_SITE_OTHER): Payer: 59 | Admitting: Family Medicine

## 2018-02-11 ENCOUNTER — Ambulatory Visit (INDEPENDENT_AMBULATORY_CARE_PROVIDER_SITE_OTHER): Payer: 59

## 2018-02-11 VITALS — BP 124/88 | HR 71 | Temp 98.3°F | Resp 14 | Ht 67.0 in | Wt 236.8 lb

## 2018-02-11 DIAGNOSIS — M542 Cervicalgia: Secondary | ICD-10-CM | POA: Insufficient documentation

## 2018-02-11 DIAGNOSIS — R0781 Pleurodynia: Secondary | ICD-10-CM

## 2018-02-11 DIAGNOSIS — R0789 Other chest pain: Secondary | ICD-10-CM | POA: Insufficient documentation

## 2018-02-11 DIAGNOSIS — S299XXA Unspecified injury of thorax, initial encounter: Secondary | ICD-10-CM | POA: Diagnosis not present
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
## 2018-02-11 NOTE — Patient Instructions (Signed)
Nice to see you. We will call you with your x-ray results. We will get you to see physical therapy.  Monitor your laceration.  It appears to be well-healing.  If you develop redness or drainage or pain please be evaluated.

## 2018-02-11 NOTE — Assessment & Plan Note (Signed)
Suspect muscular strain though she does have a history of cervical radiculopathy.  Will refer to physical therapy for treatment.
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
## 2018-02-22 ENCOUNTER — Ambulatory Visit: Payer: 59 | Admitting: Gastroenterology

## 2018-02-23 ENCOUNTER — Ambulatory Visit (INDEPENDENT_AMBULATORY_CARE_PROVIDER_SITE_OTHER): Payer: 59 | Admitting: Family Medicine

## 2018-02-23 VITALS — BP 118/79 | HR 77 | Temp 98.2°F | Ht 67.0 in | Wt 232.0 lb

## 2018-02-23 DIAGNOSIS — Z9189 Other specified personal risk factors, not elsewhere classified: Secondary | ICD-10-CM

## 2018-02-23 DIAGNOSIS — E559 Vitamin D deficiency, unspecified: Secondary | ICD-10-CM

## 2018-02-23 DIAGNOSIS — E8881 Metabolic syndrome: Secondary | ICD-10-CM | POA: Diagnosis not present

## 2018-02-23 DIAGNOSIS — Z6836 Body mass index (BMI) 36.0-36.9, adult: Secondary | ICD-10-CM | POA: Diagnosis not present

## 2018-02-23 MED ORDER — VITAMIN D (ERGOCALCIFEROL) 1.25 MG (50000 UNIT) PO CAPS: 50000.0000 [IU] | ORAL_CAPSULE | ORAL | 0 refills | Status: DC
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
## 2018-03-02 ENCOUNTER — Encounter: Payer: Self-pay | Admitting: Internal Medicine

## 2018-03-02 ENCOUNTER — Ambulatory Visit (INDEPENDENT_AMBULATORY_CARE_PROVIDER_SITE_OTHER): Payer: 59 | Admitting: Internal Medicine

## 2018-03-02 VITALS — BP 138/78 | HR 68 | Ht 67.0 in | Wt 233.0 lb

## 2018-03-02 DIAGNOSIS — E039 Hypothyroidism, unspecified: Secondary | ICD-10-CM

## 2018-03-02 DIAGNOSIS — E038 Other specified hypothyroidism: Secondary | ICD-10-CM | POA: Diagnosis not present

## 2018-03-02 DIAGNOSIS — E063 Autoimmune thyroiditis: Secondary | ICD-10-CM | POA: Diagnosis not present

## 2018-03-02 DIAGNOSIS — E538 Deficiency of other specified B group vitamins: Secondary | ICD-10-CM

## 2018-03-02 LAB — T4, FREE: FREE T4: 0.81 ng/dL (ref 0.60–1.60)

## 2018-03-02 LAB — TSH: TSH: 5.52 u[IU]/mL — AB (ref 0.35–4.50)

## 2018-03-02 MED ORDER — LEVOTHYROXINE SODIUM 75 MCG PO TABS: 75.0000 ug | ORAL_TABLET | Freq: Every day | ORAL | 1 refills | Status: DC

## 2018-03-02 NOTE — Patient Instructions (Addendum)
Please continue Levothyroxine 50 mcg daily.  Take the thyroid hormone every day, with water, at least 30 minutes before breakfast, separated by at least 4 hours from: - acid reflux medications - calcium - iron - multivitamins  Please come back for a follow-up appointment in 6 months.  
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
## 2018-03-08 ENCOUNTER — Encounter: Payer: Self-pay | Admitting: Physical Therapy

## 2018-03-08 ENCOUNTER — Ambulatory Visit: Payer: 59 | Attending: Family Medicine | Admitting: Physical Therapy

## 2018-03-08 DIAGNOSIS — M542 Cervicalgia: Secondary | ICD-10-CM | POA: Insufficient documentation
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
## 2018-03-10 ENCOUNTER — Ambulatory Visit: Payer: Self-pay | Admitting: Internal Medicine

## 2018-03-16 ENCOUNTER — Ambulatory Visit: Payer: 59 | Attending: Family Medicine | Admitting: Physical Therapy

## 2018-03-16 ENCOUNTER — Encounter: Payer: Self-pay | Admitting: Physical Therapy

## 2018-03-16 ENCOUNTER — Ambulatory Visit (INDEPENDENT_AMBULATORY_CARE_PROVIDER_SITE_OTHER): Payer: 59 | Admitting: Family Medicine

## 2018-03-16 VITALS — BP 110/68 | HR 69 | Temp 98.3°F | Ht 67.0 in | Wt 228.0 lb

## 2018-03-16 DIAGNOSIS — Z9189 Other specified personal risk factors, not elsewhere classified: Secondary | ICD-10-CM | POA: Diagnosis not present

## 2018-03-16 DIAGNOSIS — E559 Vitamin D deficiency, unspecified: Secondary | ICD-10-CM

## 2018-03-16 DIAGNOSIS — Z6835 Body mass index (BMI) 35.0-35.9, adult: Secondary | ICD-10-CM | POA: Diagnosis not present

## 2018-03-16 DIAGNOSIS — M542 Cervicalgia: Secondary | ICD-10-CM | POA: Diagnosis not present

## 2018-03-16 MED ORDER — VITAMIN D (ERGOCALCIFEROL) 1.25 MG (50000 UNIT) PO CAPS: 50000.0000 [IU] | ORAL_CAPSULE | ORAL | 0 refills | Status: DC
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
## 2018-03-18 ENCOUNTER — Ambulatory Visit: Payer: 59 | Admitting: Physical Therapy

## 2018-03-18 NOTE — Progress Notes (Signed)
Office: [phone]  /  Fax: [phone]   HPI:   Chief Complaint: OBESITY Dana Brock is here to discuss her progress with her obesity treatment plan. She is on the Category 3 plan and is following her eating plan approximately 90-95 % of the time. She states she is exercising 0 minutes 0 times per week. Dana Brock continues to do well with weight loss on Category 3 but is getting bored with the plan especially for dinner.  Her weight is 228 lb (103.4 kg) today and has had a weight loss of 4 pounds over a period of 3 weeks since her last visit. She has lost 3 lbs since starting treatment with [place].  Vitamin D Deficiency Dana Brock has a diagnosis of vitamin D deficiency. She is stable on prescription Vit D, not yet at goal. She denies nausea, vomiting or muscle weakness.  At risk for osteopenia and osteoporosis Dana Brock is at higher risk of osteopenia and osteoporosis due to vitamin D deficiency.   ALLERGIES: Allergies  Allergen Reactions  . Penicillin G Hives  . Penicillins Hives  . Meloxicam   . Pineapple     MEDICATIONS: Current Outpatient Medications on File Prior to Visit  Medication Sig Dispense Refill  . ibuprofen (ADVIL,MOTRIN) 800 MG tablet Take 1 tablet (800 mg total) by mouth every 8 (eight) hours as needed. 30 tablet 0  . levothyroxine (SYNTHROID, LEVOTHROID) 75 MCG tablet Take 1 tablet (75 mcg total) by mouth daily before breakfast. 45 tablet 1   No current facility-administered medications on file prior to visit.     PAST MEDICAL HISTORY: Past Medical History:  Diagnosis Date  . Bursitis    Right Hip  . Chickenpox   . Chronic headaches   . Chronic low back pain   . Chronic neck pain   . Fatty liver   . Hashimoto's thyroiditis 08/2017  . Obesity   . Thyroid disease   . Vitamin D deficiency     PAST SURGICAL HISTORY: Past Surgical History:  Procedure Laterality Date  . CESAREAN SECTION N/A 07/09/2015   Procedure: CESAREAN SECTION;  Surgeon: Vena Austria, MD;  Location: [facility] ORS;  Service: Obstetrics;  Laterality: N/A;  . NO PAST SURGERIES      SOCIAL HISTORY: Social History   Tobacco Use  . Smoking status: Former Smoker    Packs/day: 0.25    Years: 5.00    Pack years: 1.25    Types: Cigarettes    Last attempt to quit: 09/28/2010    Years since quitting: 7.4  . Smokeless tobacco: Never Used  Substance Use Topics  . Alcohol use: Yes    Alcohol/week: 0.6 oz    Types: 1 Standard drinks or equivalent per week  . Drug use: No    FAMILY HISTORY: Family History  Problem Relation Age of Onset  . Cancer - Other Mother   . Thyroid disease Mother   . Breast cancer Unknown        Grandmother  . Prostate cancer Unknown        Grandfather  . Hypertension Unknown        Parent  . ALS Unknown        Grandparent  . Hypertension Father   . Hyperlipidemia Father   . Thyroid disease Father     ROS: Review of Systems  Constitutional: Positive for weight loss.  Gastrointestinal: Negative for nausea and vomiting.  Musculoskeletal:       Negative muscle weakness    PHYSICAL EXAM: Blood  pressure 110/68, pulse 69, temperature 98.3 F (36.8 C), temperature source Oral, height 5\' 7"  (1.702 m), weight 228 lb (103.4 kg), last menstrual period 03/15/2018, SpO2 97 %. Body mass index is 35.71 kg/m. Physical Exam  Constitutional: She is oriented to person, place, and time. She appears well-developed and well-nourished.  Cardiovascular: Normal rate.  Pulmonary/Chest: Effort normal.  Musculoskeletal: Normal range of motion.  Neurological: She is oriented to person, place, and time.  Skin: Skin is warm and dry.  Psychiatric: She has a normal mood and affect. Her behavior is normal.  Vitals reviewed.   RECENT LABS AND TESTS: BMET    Component Value Date/Time   NA 140 01/19/2018 0905   K 4.4 01/19/2018 0905   CL 103 01/19/2018 0905   CO2 23 01/19/2018 0905   GLUCOSE 87 01/19/2018 0905   GLUCOSE 84 01/03/2018 1011   BUN 13  01/19/2018 0905   CREATININE 0.84 01/19/2018 0905   CREATININE 0.98 10/01/2017 1637   CALCIUM 9.1 01/19/2018 0905   GFRNONAA 91 01/19/2018 0905   GFRAA 105 01/19/2018 0905   Lab Results  Component Value Date   HGBA1C 5.5 01/03/2018   HGBA1C 5.6 11/27/2016   HGBA1C 5.3 10/18/2015   Lab Results  Component Value Date   INSULIN 14.4 01/19/2018   CBC    Component Value Date/Time   WBC 7.2 01/19/2018 0905   WBC 9.0 10/01/2017 1637   RBC 4.60 01/19/2018 0905   RBC 4.52 10/01/2017 1637   HGB 14.1 01/19/2018 0905   HCT 42.0 01/19/2018 0905   PLT 282 10/01/2017 1637   MCV 91 01/19/2018 0905   MCH 30.7 01/19/2018 0905   MCH 29.9 10/01/2017 1637   MCHC 33.6 01/19/2018 0905   MCHC 34.3 10/01/2017 1637   RDW 13.4 01/19/2018 0905   LYMPHSABS 1.7 01/19/2018 0905   EOSABS 0.4 01/19/2018 0905   BASOSABS 0.0 01/19/2018 0905   Iron/TIBC/Ferritin/ %Sat No results found for: IRON, TIBC, FERRITIN, IRONPCTSAT Lipid Panel     Component Value Date/Time   CHOL 160 01/03/2018 1011   TRIG 83.0 01/03/2018 1011   HDL 40.10 01/03/2018 1011   CHOLHDL 4 01/03/2018 1011   VLDL 16.6 01/03/2018 1011   LDLCALC 104 (H) 01/03/2018 1011   Hepatic Function Panel     Component Value Date/Time   PROT 7.0 01/19/2018 0905   ALBUMIN 4.5 01/19/2018 0905   AST 13 01/19/2018 0905   ALT 12 01/19/2018 0905   ALKPHOS 62 01/19/2018 0905   BILITOT 0.4 01/19/2018 0905      Component Value Date/Time   TSH 5.52 (H) 03/02/2018 0833   TSH 6.05 (H) 01/28/2018 1441   TSH 16.40 (H) 11/19/2017 1537  Results for Dana, Brock (MRN [id number]) as of 03/18/2018 10:34  Ref. Range 01/03/2018 10:11  VITD Latest Ref Range: 30.00 - 100.00 ng/mL 25.71 (L)    ASSESSMENT AND PLAN: Vitamin D deficiency - Plan: Vitamin D, Ergocalciferol, (DRISDOL) 50000 units CAPS capsule  At risk for osteoporosis  Class 2 severe obesity with serious comorbidity and body mass index (BMI) of 35.0 to 35.9 in adult, unspecified  obesity type (HCC)  PLAN:  Vitamin D Deficiency Dana Brock was informed that low vitamin D levels contributes to fatigue and are associated with obesity, breast, and colon cancer. Dana Brock agrees to continue taking prescription Vit D @50 ,000 IU every week #4 and we will refill for 1 month. She will follow up for routine testing of vitamin D, at least 2-3 times per year. She  was informed of the risk of over-replacement of vitamin D and agrees to not increase her dose unless she discusses this with us first. Judeth CornfieldStephanie agrees to follow up with our clinic in 2 to 3 weeks.  At risk for osteopenia and osteoporosis Judeth CornfieldStephanie is at risk for osteopenia and osteoporsis due to her vitamin D deficiency. She was encouraged to take her vitamin D and follow her higher calcium diet and increase strengthening exercise to help strengthen her bones and decrease her risk of osteopenia and osteoporosis.  Obesity Judeth CornfieldStephanie is currently in the action stage of change. As such, her goal is to continue with weight loss efforts She has agreed to keep a food journal with 400-600 calories and 40 grams of protein at supper daily and follow the Category 3 plan Judeth CornfieldStephanie has been instructed to work up to a goal of 150 minutes of combined cardio and strengthening exercise per week for weight loss and overall health benefits. We discussed the following Behavioral Modification Strategies today: increasing lean protein intake, decreasing simple carbohydrates, work on meal planning and easy cooking plans, holiday eating strategies, and travel eating strategies    Judeth CornfieldStephanie has agreed to follow up with our clinic in 2 to 3 weeks. She was informed of the importance of frequent follow up visits to maximize her success with intensive lifestyle modifications for her multiple health conditions.   OBESITY BEHAVIORAL INTERVENTION VISIT  Today's visit was # 3 out of 22.  Starting weight: 231 lbs Starting date: 01/19/18 Today's weight :  228 lbs  Today's date: 03/16/2018 Total lbs lost to date: 3 (Patients must lose 7 lbs in the first 6 months to continue with counseling)   ASK: We discussed the diagnosis of obesity with Kingsley PlanStephanie D Marcucci today and Judeth CornfieldStephanie agreed to give us permission to discuss obesity behavioral modification therapy today.  ASSESS: Judeth CornfieldStephanie has the diagnosis of obesity and her BMI today is 35.7 Judeth CornfieldStephanie is in the action stage of change   ADVISE: Judeth CornfieldStephanie was educated on the multiple health risks of obesity as well as the benefit of weight loss to improve her health. She was advised of the need for long term treatment and the importance of lifestyle modifications.  AGREE: Multiple dietary modification options and treatment options were discussed and  Judeth CornfieldStephanie agreed to the above obesity treatment plan.  I, Burt KnackSharon Martin, am acting as transcriptionist for Quillian Quincearen Beasley, MD  I have reviewed the above documentation for accuracy and completeness, and I agree with the above. -Quillian Quincearen Beasley, MD

## 2018-03-23 ENCOUNTER — Encounter: Payer: Self-pay | Admitting: Physical Therapy

## 2018-03-23 ENCOUNTER — Ambulatory Visit: Payer: 59 | Admitting: Physical Therapy

## 2018-03-23 DIAGNOSIS — M542 Cervicalgia: Secondary | ICD-10-CM
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
## 2018-03-25 ENCOUNTER — Encounter: Payer: Self-pay | Admitting: Physical Therapy

## 2018-03-25 ENCOUNTER — Ambulatory Visit: Payer: 59 | Admitting: Physical Therapy

## 2018-03-25 DIAGNOSIS — M542 Cervicalgia: Secondary | ICD-10-CM
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
## 2018-03-30 ENCOUNTER — Ambulatory Visit: Payer: 59 | Admitting: Physical Therapy

## 2018-03-30 ENCOUNTER — Encounter: Payer: Self-pay | Admitting: Physical Therapy

## 2018-03-30 DIAGNOSIS — M542 Cervicalgia: Secondary | ICD-10-CM | POA: Diagnosis not present
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
## 2018-03-31 ENCOUNTER — Ambulatory Visit: Payer: Self-pay | Admitting: Family Medicine

## 2018-04-01 ENCOUNTER — Encounter: Payer: Self-pay | Admitting: Family Medicine

## 2018-04-01 ENCOUNTER — Ambulatory Visit: Payer: 59 | Admitting: Physical Therapy

## 2018-04-01 ENCOUNTER — Ambulatory Visit (INDEPENDENT_AMBULATORY_CARE_PROVIDER_SITE_OTHER): Payer: 59 | Admitting: Family Medicine

## 2018-04-01 DIAGNOSIS — E063 Autoimmune thyroiditis: Secondary | ICD-10-CM

## 2018-04-01 DIAGNOSIS — E559 Vitamin D deficiency, unspecified: Secondary | ICD-10-CM | POA: Diagnosis not present

## 2018-04-01 DIAGNOSIS — E538 Deficiency of other specified B group vitamins: Secondary | ICD-10-CM | POA: Diagnosis not present

## 2018-04-01 DIAGNOSIS — E038 Other specified hypothyroidism: Secondary | ICD-10-CM | POA: Diagnosis not present

## 2018-04-01 DIAGNOSIS — L905 Scar conditions and fibrosis of skin: Secondary | ICD-10-CM | POA: Diagnosis not present
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
## 2018-04-01 NOTE — Patient Instructions (Addendum)
Nice to see you. Please continue to see the endocrinologist and weight loss specialist.

## 2018-04-01 NOTE — Assessment & Plan Note (Signed)
She will continue to see endocrinology. 

## 2018-04-01 NOTE — Assessment & Plan Note (Signed)
On supplement through obesity specialist.  Encouraged to have this rechecked through them.
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
## 2018-04-01 NOTE — Assessment & Plan Note (Signed)
On supplement through endocrinology.  Encouraged her to have this rechecked through them.

## 2018-04-06 ENCOUNTER — Ambulatory Visit: Payer: 59 | Admitting: Physical Therapy

## 2018-04-07 ENCOUNTER — Ambulatory Visit (INDEPENDENT_AMBULATORY_CARE_PROVIDER_SITE_OTHER): Payer: 59 | Admitting: Family Medicine

## 2018-04-08 ENCOUNTER — Ambulatory Visit: Payer: 59 | Admitting: Physical Therapy

## 2018-04-11 ENCOUNTER — Ambulatory Visit (INDEPENDENT_AMBULATORY_CARE_PROVIDER_SITE_OTHER): Payer: 59 | Admitting: Family Medicine

## 2018-04-11 VITALS — BP 108/72 | HR 88 | Temp 98.1°F | Ht 67.0 in | Wt 231.0 lb

## 2018-04-11 DIAGNOSIS — E559 Vitamin D deficiency, unspecified: Secondary | ICD-10-CM | POA: Diagnosis not present

## 2018-04-11 DIAGNOSIS — Z6836 Body mass index (BMI) 36.0-36.9, adult: Secondary | ICD-10-CM | POA: Diagnosis not present

## 2018-04-11 DIAGNOSIS — Z9189 Other specified personal risk factors, not elsewhere classified: Secondary | ICD-10-CM

## 2018-04-11 MED ORDER — VITAMIN D (ERGOCALCIFEROL) 1.25 MG (50000 UNIT) PO CAPS: 50000.0000 [IU] | ORAL_CAPSULE | ORAL | 0 refills | Status: DC

## 2018-04-11 NOTE — Progress Notes (Signed)
Office: [phone]  /  Fax: [phone]   HPI:   Chief Complaint: OBESITY Dana Brock is here to discuss her progress with her obesity treatment Brock. She is on the keep a food journal with 400 to 600 calories and 40 grams of protein at supper daily and the Category 3 Brock and is following her eating Brock approximately 75 % of the time. She states she is doing [employer] and physical therapy 28 minutes 7 times per week. Dana Brock has increased celebration eating on vacation, but states she is ready to get back on track. She has increased exercise recently. Dana Brock is bored with the category 3 Brock, but knows she does better with a structured Brock. Her weight is 231 lb (104.8 kg) today and has had a weight gain of 3 pounds over a period of 3 to 4 weeks since her last visit. She has lost 0 lbs since starting treatment with [place].  Vitamin Brock deficiency Dana Brock has a diagnosis of vitamin Brock deficiency. Dana Brock is stable on vit Brock but she is not yet at goal. Dana Brock denies nausea, vomiting or muscle weakness.  At risk for osteopenia and osteoporosis Dana Brock is at higher risk of osteopenia and osteoporosis due to vitamin Brock deficiency.   ALLERGIES: Allergies  Allergen Reactions  . Penicillin G Hives  . Penicillins Hives  . Meloxicam   . Pineapple     MEDICATIONS: Current Outpatient Medications on File Prior to Visit  Medication Sig Dispense Refill  . ibuprofen (ADVIL,MOTRIN) 800 MG tablet Take 1 tablet (800 mg total) by mouth every 8 (eight) hours as needed. 30 tablet 0  . levothyroxine (SYNTHROID, LEVOTHROID) 75 MCG tablet Take 1 tablet (75 mcg total) by mouth daily before breakfast. 45 tablet 1   No current facility-administered medications on file prior to visit.     PAST MEDICAL HISTORY: Past Medical History:  Diagnosis Date  . Bursitis    Right Hip  . Chickenpox   . Chronic headaches   . Chronic low back pain   . Chronic neck pain   . Fatty liver   . Hashimoto's  thyroiditis 08/2017  . Obesity   . Thyroid disease   . Vitamin Brock deficiency     PAST SURGICAL HISTORY: Past Surgical History:  Procedure Laterality Date  . CESAREAN SECTION N/A 07/09/2015   Procedure: CESAREAN SECTION;  Surgeon: Vena Austria, MD;  Location: [facility] ORS;  Service: Obstetrics;  Laterality: N/A;  . NO PAST SURGERIES      SOCIAL HISTORY: Social History   Tobacco Use  . Smoking status: Former Smoker    Packs/day: 0.25    Years: 5.00    Pack years: 1.25    Types: Cigarettes    Last attempt to quit: 09/28/2010    Years since quitting: 7.5  . Smokeless tobacco: Never Used  Substance Use Topics  . Alcohol use: Yes    Alcohol/week: 0.6 oz    Types: 1 Standard drinks or equivalent per week  . Drug use: No    FAMILY HISTORY: Family History  Problem Relation Age of Onset  . Cancer - Other Mother   . Thyroid disease Mother   . Breast cancer Unknown        Grandmother  . Prostate cancer Unknown        Grandfather  . Hypertension Unknown        Parent  . ALS Unknown        Grandparent  . Hypertension Father   . Hyperlipidemia  Father   . Thyroid disease Father     ROS: Review of Systems  Constitutional: Negative for weight loss.  Gastrointestinal: Negative for nausea and vomiting.  Musculoskeletal:       Negative for muscle weakness    PHYSICAL EXAM: Blood pressure 108/72, pulse 88, temperature 98.1 F (36.7 C), temperature source Oral, height 5\' 7"  (1.702 m), weight 231 lb (104.8 kg), last menstrual period 03/15/2018, SpO2 97 %. Body mass index is 36.18 kg/m. Physical Exam  Constitutional: She is oriented to person, place, and time. She appears well-developed and well-nourished.  Cardiovascular: Normal rate.  Pulmonary/Chest: Effort normal.  Musculoskeletal: Normal range of motion.  Neurological: She is oriented to person, place, and time.  Skin: Skin is warm and dry.  Psychiatric: She has a normal mood and affect. Her behavior is normal.    Vitals reviewed.   RECENT LABS AND TESTS: BMET    Component Value Date/Time   NA 140 01/19/2018 0905   K 4.4 01/19/2018 0905   CL 103 01/19/2018 0905   CO2 23 01/19/2018 0905   GLUCOSE 87 01/19/2018 0905   GLUCOSE 84 01/03/2018 1011   BUN 13 01/19/2018 0905   CREATININE 0.84 01/19/2018 0905   CREATININE 0.98 10/01/2017 1637   CALCIUM 9.1 01/19/2018 0905   GFRNONAA 91 01/19/2018 0905   GFRAA 105 01/19/2018 0905   Lab Results  Component Value Date   HGBA1C 5.5 01/03/2018   HGBA1C 5.6 11/27/2016   HGBA1C 5.3 10/18/2015   Lab Results  Component Value Date   INSULIN 14.4 01/19/2018   CBC    Component Value Date/Time   WBC 7.2 01/19/2018 0905   WBC 9.0 10/01/2017 1637   RBC 4.60 01/19/2018 0905   RBC 4.52 10/01/2017 1637   HGB 14.1 01/19/2018 0905   HCT 42.0 01/19/2018 0905   PLT 282 10/01/2017 1637   MCV 91 01/19/2018 0905   MCH 30.7 01/19/2018 0905   MCH 29.9 10/01/2017 1637   MCHC 33.6 01/19/2018 0905   MCHC 34.3 10/01/2017 1637   RDW 13.4 01/19/2018 0905   LYMPHSABS 1.7 01/19/2018 0905   EOSABS 0.4 01/19/2018 0905   BASOSABS 0.0 01/19/2018 0905   Iron/TIBC/Ferritin/ %Sat No results found for: IRON, TIBC, FERRITIN, IRONPCTSAT Lipid Panel     Component Value Date/Time   CHOL 160 01/03/2018 1011   TRIG 83.0 01/03/2018 1011   HDL 40.10 01/03/2018 1011   CHOLHDL 4 01/03/2018 1011   VLDL 16.6 01/03/2018 1011   LDLCALC 104 (H) 01/03/2018 1011   Hepatic Function Panel     Component Value Date/Time   PROT 7.0 01/19/2018 0905   ALBUMIN 4.5 01/19/2018 0905   AST 13 01/19/2018 0905   ALT 12 01/19/2018 0905   ALKPHOS 62 01/19/2018 0905   BILITOT 0.4 01/19/2018 0905      Component Value Date/Time   TSH 5.52 (H) 03/02/2018 0833   TSH 6.05 (H) 01/28/2018 1441   TSH 16.40 (H) 11/19/2017 1537   Results for Dana PlanCKENHAM, Dana Brock (MRN [id number]) as of 04/11/2018 16:56  Ref. Range 10/01/2017 16:37  Vitamin Brock, 25-Hydroxy Latest Ref Range: 30 - 100 ng/mL 17 (L)    ASSESSMENT AND Brock: Vitamin Brock deficiency - Brock: Vitamin Brock, Ergocalciferol, (DRISDOL) 50000 units CAPS capsule  At risk for osteoporosis  Class 2 severe obesity with serious comorbidity and body mass index (BMI) of 36.0 to 36.9 in adult, unspecified obesity type (HCC)  Brock:  Vitamin Brock Deficiency Dana Brock was informed that low vitamin Brock levels  contributes to fatigue and are associated with obesity, breast, and colon cancer. She agrees to continue to take prescription Vit Brock @50 ,000 IU every week #4 with no refills and will follow up for routine testing of vitamin Brock, at least 2-3 times per year. She was informed of the risk of over-replacement of vitamin Brock and agrees to not increase her dose unless she discusses this with [place] first. Dana Brock agrees to follow up as directed.  At risk for osteopenia and osteoporosis Dana Brock is at risk for osteopenia and osteoporosis due to her vitamin Brock deficiency. She was encouraged to take her vitamin Brock and follow her higher calcium diet and increase strengthening exercise to help strengthen her bones and decrease her risk of osteopenia and osteoporosis.  Obesity Dana Brock is currently in the action stage of change. As such, her goal is to continue with weight loss efforts She has agreed to follow the Pescatarian eating Brock Dana Brock has been instructed to work up to a goal of 150 minutes of combined cardio and strengthening exercise per week for weight loss and overall health benefits. We discussed the following Behavioral Modification Strategies today: increasing lean protein intake, increasing vegetables and work on meal planning and easy cooking plans  Dana Brock has agreed to follow up with our clinic in 2 weeks. She was informed of the importance of frequent follow up visits to maximize her success with intensive lifestyle modifications for her multiple health conditions.   OBESITY BEHAVIORAL INTERVENTION VISIT  Today's visit was # 4 out of  22.  Starting weight: 231 lbs Starting date: 01/19/18 Today's weight : 231 lbs Today's date: 04/11/2018 Total lbs lost to date: 0    ASK: We discussed the diagnosis of obesity with Dana Brock today and Martin agreed to give [place] permission to discuss obesity behavioral modification therapy today.  ASSESS: Anzal has the diagnosis of obesity and her BMI today is 36.17 Alenna is in the action stage of change   ADVISE: Josue was educated on the multiple health risks of obesity as well as the benefit of weight loss to improve her health. She was advised of the need for long term treatment and the importance of lifestyle modifications.  AGREE: Multiple dietary modification options and treatment options were discussed and  Zondra agreed to the above obesity treatment Brock.  I, [place] Crane, am acting as transcriptionist for Quillian Quince, MD  I have reviewed the above documentation for accuracy and completeness, and I agree with the above. -Quillian Quince, MD

## 2018-04-13 ENCOUNTER — Ambulatory Visit: Payer: 59 | Admitting: Physical Therapy

## 2018-04-13 ENCOUNTER — Encounter: Payer: Self-pay | Admitting: Physical Therapy

## 2018-04-13 DIAGNOSIS — M542 Cervicalgia: Secondary | ICD-10-CM
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
## 2018-04-18 ENCOUNTER — Ambulatory Visit: Payer: 59 | Admitting: Physical Therapy

## 2018-04-22 ENCOUNTER — Ambulatory Visit: Payer: 59 | Attending: Family Medicine | Admitting: Physical Therapy

## 2018-04-22 ENCOUNTER — Encounter: Payer: Self-pay | Admitting: Physical Therapy

## 2018-04-22 DIAGNOSIS — M542 Cervicalgia: Secondary | ICD-10-CM | POA: Diagnosis not present
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
## 2018-04-25 ENCOUNTER — Ambulatory Visit (INDEPENDENT_AMBULATORY_CARE_PROVIDER_SITE_OTHER): Payer: 59 | Admitting: Family Medicine

## 2018-04-25 ENCOUNTER — Ambulatory Visit: Payer: 59 | Admitting: Physical Therapy

## 2018-04-25 VITALS — BP 106/71 | HR 77 | Temp 98.3°F | Ht 67.0 in | Wt 229.0 lb

## 2018-04-25 DIAGNOSIS — E063 Autoimmune thyroiditis: Secondary | ICD-10-CM

## 2018-04-25 DIAGNOSIS — Z6836 Body mass index (BMI) 36.0-36.9, adult: Secondary | ICD-10-CM

## 2018-04-26 NOTE — Progress Notes (Signed)
Office: [phone]  /  Fax: [phone]   HPI:   Chief Complaint: OBESITY Dana Brock is here to discuss her progress with her obesity treatment plan. She is on the Pescatarian eating plan and is following her eating plan approximately 90 % of the time. She states she is doing physical therapy and morning meltdown for 30-45 minutes 7 times per week. Jozalynn was changed to [employer] and likes this plan better. She is doing physical therapy as exercise and in-home workout 20-30 minutes.  Her weight is 229 lb (103.9 kg) today and has had a weight loss of 2 pounds over a period of 2 weeks since her last visit. She has lost 2 lbs since starting treatment with [place].  Hashimoto's Disease Dana Brock is on Synthroid, increased to 75 mcg but she started the higher dose recently. She denies palpitations and notes no change in fatigue.  ALLERGIES: Allergies  Allergen Reactions  . Penicillin G Hives  . Penicillins Hives  . Meloxicam   . Pineapple     MEDICATIONS: Current Outpatient Medications on File Prior to Visit  Medication Sig Dispense Refill  . ibuprofen (ADVIL,MOTRIN) 800 MG tablet Take 1 tablet (800 mg total) by mouth every 8 (eight) hours as needed. 30 tablet 0  . levothyroxine (SYNTHROID, LEVOTHROID) 75 MCG tablet Take 1 tablet (75 mcg total) by mouth daily before breakfast. 45 tablet 1  . Vitamin D, Ergocalciferol, (DRISDOL) 50000 units CAPS capsule Take 1 capsule (50,000 Units total) by mouth every 7 (seven) days. 4 capsule 0   No current facility-administered medications on file prior to visit.     PAST MEDICAL HISTORY: Past Medical History:  Diagnosis Date  . Bursitis    Right Hip  . Chickenpox   . Chronic headaches   . Chronic low back pain   . Chronic neck pain   . Fatty liver   . Hashimoto's thyroiditis 08/2017  . Obesity   . Thyroid disease   . Vitamin D deficiency     PAST SURGICAL HISTORY: Past Surgical History:  Procedure Laterality Date  . CESAREAN  SECTION N/A 07/09/2015   Procedure: CESAREAN SECTION;  Surgeon: Vena Austria, MD;  Location: [facility] ORS;  Service: Obstetrics;  Laterality: N/A;  . NO PAST SURGERIES      SOCIAL HISTORY: Social History   Tobacco Use  . Smoking status: Former Smoker    Packs/day: 0.25    Years: 5.00    Pack years: 1.25    Types: Cigarettes    Last attempt to quit: 09/28/2010    Years since quitting: 7.5  . Smokeless tobacco: Never Used  Substance Use Topics  . Alcohol use: Yes    Alcohol/week: 1.0 standard drinks    Types: 1 Standard drinks or equivalent per week  . Drug use: No    FAMILY HISTORY: Family History  Problem Relation Age of Onset  . Cancer - Other Mother   . Thyroid disease Mother   . Breast cancer Unknown        Grandmother  . Prostate cancer Unknown        Grandfather  . Hypertension Unknown        Parent  . ALS Unknown        Grandparent  . Hypertension Father   . Hyperlipidemia Father   . Thyroid disease Father     ROS: Review of Systems  Constitutional: Positive for malaise/fatigue and weight loss.  Cardiovascular: Negative for palpitations.    PHYSICAL EXAM: Blood pressure 106/71, pulse  77, temperature 98.3 F (36.8 C), temperature source Oral, height 5\' 7"  (1.702 m), weight 229 lb (103.9 kg), last menstrual period 04/15/2018, SpO2 97 %. Body mass index is 35.87 kg/m. Physical Exam  Constitutional: She is oriented to person, place, and time. She appears well-developed and well-nourished.  Cardiovascular: Normal rate.  Pulmonary/Chest: Effort normal.  Musculoskeletal: Normal range of motion.  Neurological: She is oriented to person, place, and time.  Skin: Skin is warm and dry.  Psychiatric: She has a normal mood and affect. Her behavior is normal.  Vitals reviewed.   RECENT LABS AND TESTS: BMET    Component Value Date/Time   NA 140 01/19/2018 0905   K 4.4 01/19/2018 0905   CL 103 01/19/2018 0905   CO2 23 01/19/2018 0905   GLUCOSE 87 01/19/2018  0905   GLUCOSE 84 01/03/2018 1011   BUN 13 01/19/2018 0905   CREATININE 0.84 01/19/2018 0905   CREATININE 0.98 10/01/2017 1637   CALCIUM 9.1 01/19/2018 0905   GFRNONAA 91 01/19/2018 0905   GFRAA 105 01/19/2018 0905   Lab Results  Component Value Date   HGBA1C 5.5 01/03/2018   HGBA1C 5.6 11/27/2016   HGBA1C 5.3 10/18/2015   Lab Results  Component Value Date   INSULIN 14.4 01/19/2018   CBC    Component Value Date/Time   WBC 7.2 01/19/2018 0905   WBC 9.0 10/01/2017 1637   RBC 4.60 01/19/2018 0905   RBC 4.52 10/01/2017 1637   HGB 14.1 01/19/2018 0905   HCT 42.0 01/19/2018 0905   PLT 282 10/01/2017 1637   MCV 91 01/19/2018 0905   MCH 30.7 01/19/2018 0905   MCH 29.9 10/01/2017 1637   MCHC 33.6 01/19/2018 0905   MCHC 34.3 10/01/2017 1637   RDW 13.4 01/19/2018 0905   LYMPHSABS 1.7 01/19/2018 0905   EOSABS 0.4 01/19/2018 0905   BASOSABS 0.0 01/19/2018 0905   Iron/TIBC/Ferritin/ %Sat No results found for: IRON, TIBC, FERRITIN, IRONPCTSAT Lipid Panel     Component Value Date/Time   CHOL 160 01/03/2018 1011   TRIG 83.0 01/03/2018 1011   HDL 40.10 01/03/2018 1011   CHOLHDL 4 01/03/2018 1011   VLDL 16.6 01/03/2018 1011   LDLCALC 104 (H) 01/03/2018 1011   Hepatic Function Panel     Component Value Date/Time   PROT 7.0 01/19/2018 0905   ALBUMIN 4.5 01/19/2018 0905   AST 13 01/19/2018 0905   ALT 12 01/19/2018 0905   ALKPHOS 62 01/19/2018 0905   BILITOT 0.4 01/19/2018 0905      Component Value Date/Time   TSH 5.52 (H) 03/02/2018 0833   TSH 6.05 (H) 01/28/2018 1441   TSH 16.40 (H) 11/19/2017 1537    ASSESSMENT AND PLAN: Hashimoto's disease  Class 2 severe obesity with serious comorbidity and body mass index (BMI) of 36.0 to 36.9 in adult, unspecified obesity type (HCC)  PLAN:  Hashimoto's Disease Dana Brock agrees to continue taking Synthroid 75 mcg and we will recheck labs in 1 month. Dana Brock agrees to follow up with our clinic in 2 to 3 weeks.  We spent >  than 50% of the 15 minute visit on the counseling as documented in the note.  Obesity Dana Brock is currently in the action stage of change. As such, her goal is to continue with weight loss efforts She has agreed to follow the Pescatarian eating plan Dana Brock has been instructed to work up to a goal of 150 minutes of combined cardio and strengthening exercise per week for weight loss and overall  health benefits. We discussed the following Behavioral Modification Strategies today: increasing lean protein intake and decreasing simple carbohydrates    Dana Brock has agreed to follow up with our clinic in 2 to 3 weeks. She was informed of the importance of frequent follow up visits to maximize her success with intensive lifestyle modifications for her multiple health conditions.   OBESITY BEHAVIORAL INTERVENTION VISIT  Today's visit was # 5 out of 22.  Starting weight: 231 lbs Starting date: 01/19/18 Today's weight : 229 lbs  Today's date: 04/25/2018 Total lbs lost to date: 2    ASK: We discussed the diagnosis of obesity with [name] today and Dana Brock agreed to give us permission to discuss obesity behavioral modification therapy today.  ASSESS: Dana Brock has the diagnosis of obesity and her BMI today is 35.86 Dana Brock is in the action stage of change   ADVISE: Dana Brock was educated on the multiple health risks of obesity as well as the benefit of weight loss to improve her health. She was advised of the need for long term treatment and the importance of lifestyle modifications.  AGREE: Multiple dietary modification options and treatment options were discussed and  Dana Brock agreed to the above obesity treatment plan.  I, Burt KnackSharon Martin, am acting as transcriptionist for Quillian Quincearen Beasley, MD  I have reviewed the above documentation for accuracy and completeness, and I agree with the above. -Quillian Quincearen Beasley, MD

## 2018-04-28 ENCOUNTER — Ambulatory Visit: Payer: 59 | Admitting: Physical Therapy

## 2018-04-28 ENCOUNTER — Encounter: Payer: Self-pay | Admitting: Physical Therapy

## 2018-04-28 DIAGNOSIS — M542 Cervicalgia: Secondary | ICD-10-CM
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
## 2018-04-29 ENCOUNTER — Encounter: Payer: Self-pay | Admitting: Physical Therapy

## 2018-04-29 ENCOUNTER — Ambulatory Visit: Payer: 59 | Admitting: Physical Therapy

## 2018-04-29 DIAGNOSIS — M542 Cervicalgia: Secondary | ICD-10-CM
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
## 2018-05-04 ENCOUNTER — Ambulatory Visit: Payer: 59

## 2018-05-10 ENCOUNTER — Ambulatory Visit: Payer: 59 | Admitting: Physical Therapy

## 2018-05-12 ENCOUNTER — Ambulatory Visit: Payer: 59 | Admitting: Physical Therapy

## 2018-05-12 ENCOUNTER — Encounter: Payer: Self-pay | Admitting: Physical Therapy

## 2018-05-12 DIAGNOSIS — M542 Cervicalgia: Secondary | ICD-10-CM | POA: Diagnosis not present
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
## 2018-05-18 ENCOUNTER — Ambulatory Visit (INDEPENDENT_AMBULATORY_CARE_PROVIDER_SITE_OTHER): Payer: 59 | Admitting: Family Medicine

## 2018-05-18 ENCOUNTER — Encounter (INDEPENDENT_AMBULATORY_CARE_PROVIDER_SITE_OTHER): Payer: Self-pay

## 2018-05-19 ENCOUNTER — Encounter: Payer: Self-pay | Admitting: Physical Therapy

## 2018-05-19 ENCOUNTER — Ambulatory Visit: Payer: 59 | Attending: Family Medicine | Admitting: Physical Therapy

## 2018-05-19 DIAGNOSIS — M542 Cervicalgia: Secondary | ICD-10-CM | POA: Diagnosis not present
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
## 2018-05-24 ENCOUNTER — Ambulatory Visit: Payer: 59 | Admitting: Physical Therapy

## 2018-06-06 ENCOUNTER — Other Ambulatory Visit: Payer: Self-pay | Admitting: Internal Medicine

## 2018-06-06 NOTE — Telephone Encounter (Signed)
Last OV noted patient to take 50 mcg daily.  Please advise.

## 2018-06-06 NOTE — Telephone Encounter (Signed)
Yes, but she had labs since then, in June and this is the message I sent her then:  Viewed by Kingsley PlanStephanie D Bachtell on 03/02/2018 1:45 PM  Written by Carlus PavlovGherghe, Ketan Renz, MD on 03/02/2018 1:44 PM   Dear Ms. Schellhase,  Your TSH is still high, so let us increase your dose of levothyroxine to 75 mcg daily. Please come back for labs in 5 weeks.  Please call our main office number [phone]) to schedule a lab appointment.  Sincerely,  Carlus Pavlovristina Myrtice Lowdermilk MD

## 2018-07-29 ENCOUNTER — Other Ambulatory Visit (INDEPENDENT_AMBULATORY_CARE_PROVIDER_SITE_OTHER): Payer: 59

## 2018-07-29 DIAGNOSIS — E063 Autoimmune thyroiditis: Secondary | ICD-10-CM

## 2018-07-29 DIAGNOSIS — E038 Other specified hypothyroidism: Secondary | ICD-10-CM | POA: Diagnosis not present

## 2018-07-29 LAB — T4, FREE: FREE T4: 0.91 ng/dL (ref 0.60–1.60)

## 2018-07-29 LAB — TSH: TSH: 3.6 u[IU]/mL (ref 0.35–4.50)

## 2018-09-02 ENCOUNTER — Ambulatory Visit (INDEPENDENT_AMBULATORY_CARE_PROVIDER_SITE_OTHER): Payer: 59 | Admitting: Internal Medicine

## 2018-09-02 ENCOUNTER — Encounter: Payer: Self-pay | Admitting: Internal Medicine

## 2018-09-02 VITALS — BP 122/78 | HR 72 | Ht 67.0 in | Wt 227.6 lb

## 2018-09-02 DIAGNOSIS — E559 Vitamin D deficiency, unspecified: Secondary | ICD-10-CM

## 2018-09-02 DIAGNOSIS — E063 Autoimmune thyroiditis: Secondary | ICD-10-CM | POA: Diagnosis not present

## 2018-09-02 DIAGNOSIS — E038 Other specified hypothyroidism: Secondary | ICD-10-CM

## 2018-09-02 DIAGNOSIS — E538 Deficiency of other specified B group vitamins: Secondary | ICD-10-CM | POA: Diagnosis not present

## 2018-09-02 LAB — TSH: TSH: 2.96 u[IU]/mL (ref 0.35–4.50)

## 2018-09-02 LAB — VITAMIN D 25 HYDROXY (VIT D DEFICIENCY, FRACTURES): VITD: 22.41 ng/mL — ABNORMAL LOW (ref 30.00–100.00)

## 2018-09-02 LAB — VITAMIN B12: Vitamin B-12: 386 pg/mL (ref 211–911)

## 2018-09-02 LAB — T4, FREE: Free T4: 0.79 ng/dL (ref 0.60–1.60)

## 2018-09-02 MED ORDER — LEVOTHYROXINE SODIUM 75 MCG PO TABS: 75.0000 ug | ORAL_TABLET | Freq: Every day | ORAL | 3 refills | Status: DC
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
## 2018-09-02 NOTE — Patient Instructions (Signed)
Please continue Levothyroxine 75 mcg daily.  Take the thyroid hormone every day, with water, at least 30 minutes before breakfast, separated by at least 4 hours from: - acid reflux medications - calcium - iron - multivitamins  Please stop at the lab.  Please come back for a follow-up appointment in 1 year. 

## 2018-11-11 ENCOUNTER — Ambulatory Visit (INDEPENDENT_AMBULATORY_CARE_PROVIDER_SITE_OTHER): Payer: Self-pay | Admitting: Physician Assistant

## 2018-11-11 VITALS — BP 130/90 | HR 78 | Temp 97.7°F | Resp 16 | Wt 229.0 lb

## 2018-11-11 DIAGNOSIS — R0982 Postnasal drip: Secondary | ICD-10-CM

## 2018-11-11 DIAGNOSIS — J3489 Other specified disorders of nose and nasal sinuses: Secondary | ICD-10-CM

## 2018-11-11 DIAGNOSIS — J029 Acute pharyngitis, unspecified: Secondary | ICD-10-CM

## 2018-11-11 LAB — POCT RAPID STREP A (OFFICE): Rapid Strep A Screen: NEGATIVE

## 2018-11-11 MED ORDER — LORATADINE-PSEUDOEPHEDRINE ER 5-120 MG PO TB12: 1.0000 | ORAL_TABLET | Freq: Two times a day (BID) | ORAL | 0 refills | Status: AC

## 2018-11-11 MED ORDER — FLUTICASONE PROPIONATE 50 MCG/ACT NA SUSP: 2.0000 | Freq: Every day | NASAL | 0 refills | Status: AC
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
## 2018-11-11 NOTE — Patient Instructions (Addendum)
Thank you for choosing [facility] for your health care needs.  You have been diagnosed with a sore throat.  Your rapid strep test was NEGATIVE.  Recommend gargling with salt water. Use analgesic throat spray or throat lozenges such as Cepacol or Chloraseptic.  Increase fluids; water, Gatorade, hot tea with lemon/honey.  Take over the counter Tylenol or ibuprofen for pain/discomfort.  Take medications as prescribed: Meds ordered this encounter  Medications  . loratadine-pseudoephedrine (CLARITIN-D 12 HOUR) 5-120 MG tablet    Sig: Take 1 tablet by mouth 2 (two) times daily for 7 days.    Dispense:  14 tablet    Refill:  0    Order Specific Question:   Supervising Provider    Answer:   MILLER, BRIAN [[id number]]  . fluticasone (FLONASE) 50 MCG/ACT nasal spray    Sig: Place 2 sprays into both nostrils daily.    Dispense:  16 g    Refill:  0    Order Specific Question:   Supervising Provider    Answer:   Eber Hong [[id number]]   Follow-up with family physician, urgent care, or [facility] in one week if symptoms not improving. Sooner with any worsening symptoms.  Hope you feel better soon! Enjoy your vacation!  Sore Throat When you have a sore throat, your throat may feel:  Tender.  Burning.  Irritated.  Scratchy.  Painful when you swallow.  Painful when you talk. Many things can cause a sore throat, such as:  An infection.  Allergies.  Dry air.  Smoke or pollution.  Radiation treatment.  Gastroesophageal reflux disease (GERD).  A tumor. A sore throat can be the first sign of another sickness. It can happen with other problems, like:  Coughing.  Sneezing.  Fever.  Swelling in the neck. Most sore throats go away without treatment. Follow these instructions at home:      Take over-the-counter medicines only as told by your doctor. ? If your child has a sore throat, do not give your child aspirin.  Drink enough fluids to keep your pee (urine) pale  yellow.  Rest when you feel you need to.  To help with pain: ? Sip warm liquids, such as broth, herbal tea, or warm water. ? Eat or drink cold or frozen liquids, such as frozen ice pops. ? Gargle with a salt-water mixture 3-4 times a day or as needed. To make a salt-water mixture, add -1 tsp (3-6 g) of salt to 1 cup (237 mL) of warm water. Mix it until you cannot see the salt anymore. ? Suck on hard candy or throat lozenges. ? Put a cool-mist humidifier in your bedroom at night. ? Sit in the bathroom with the door closed for 5-10 minutes while you run hot water in the shower.  Do not use any products that contain nicotine or tobacco, such as cigarettes, e-cigarettes, and chewing tobacco. If you need help quitting, ask your doctor.  Wash your hands well and often with soap and water. If soap and water are not available, use hand sanitizer. Contact a doctor if:  You have a fever for more than 2-3 days.  You keep having symptoms for more than 2-3 days.  Your throat does not get better in 7 days.  You have a fever and your symptoms suddenly get worse.  Your child who is 3 months to 48 years old has a temperature of 102.96F (39C) or higher. Get help right away if:  You have trouble breathing.  You cannot swallow  fluids, soft foods, or your saliva.  You have swelling in your throat or neck that gets worse.  You keep feeling sick to your stomach (nauseous).  You keep throwing up (vomiting). Summary  A sore throat is pain, burning, irritation, or scratchiness in the throat. Many things can cause a sore throat.  Take over-the-counter medicines only as told by your doctor. Do not give your child aspirin.  Drink plenty of fluids, and rest as needed.  Contact a doctor if your symptoms get worse or your sore throat does not get better within 7 days. This information is not intended to replace advice given to you by your health care provider. Make sure you discuss any questions you  have with your health care provider. Document Released: 06/09/2008 Document Revised: 01/31/2018 Document Reviewed: 01/31/2018 Elsevier Interactive Patient Education  2019 [employer].

## 2018-11-14 ENCOUNTER — Telehealth: Payer: Self-pay | Admitting: Emergency Medicine

## 2018-11-14 NOTE — Telephone Encounter (Signed)
Left message following up on visit [facility]

## 2018-12-10 MED FILL — LEVOTHYROXINE 75 MCG TABLET: 75 | 90 days supply | Qty: 90 | Fill #0

## 2019-03-20 ENCOUNTER — Telehealth: Payer: Self-pay | Admitting: Internal Medicine

## 2019-03-20 MED ORDER — LEVOTHYROXINE SODIUM 75 MCG PO TABS: 75.0000 ug | ORAL_TABLET | Freq: Every day | ORAL | 3 refills | Status: DC

## 2019-03-20 NOTE — Telephone Encounter (Signed)
MEDICATION: levothyroxine (SYNTHROID, LEVOTHROID) 75 MCG tablet  PHARMACY:  [facility], [place] - [address]  IS THIS A 90 DAY SUPPLY :   IS PATIENT OUT OF MEDICATION:   IF NOT; HOW MUCH IS LEFT:   LAST APPOINTMENT DATE: @Visit  date not found  NEXT APPOINTMENT DATE:@12 /16/2020  DO WE HAVE YOUR PERMISSION TO LEAVE A DETAILED MESSAGE:  OTHER COMMENTS:    **Let patient know to contact pharmacy at the end of the day to make sure medication is ready. **  ** Please notify patient to allow 48-72 hours to process**  **Encourage patient to contact the pharmacy for refills or they can request refills through [facility]**

## 2019-04-07 ENCOUNTER — Ambulatory Visit: Payer: Self-pay | Admitting: Family Medicine

## 2019-07-06 MED ORDER — LEVOTHYROXINE SODIUM 75 MCG PO TABS: 75.0000 ug | ORAL_TABLET | Freq: Every day | ORAL | 0 refills | Status: DC

## 2019-07-06 NOTE — Addendum Note (Signed)
Addended by: Cardell Peach I on: 07/06/2019 11:36 AM   Modules accepted: Orders

## 2019-07-06 NOTE — Telephone Encounter (Signed)
90day supply sent.

## 2019-07-06 NOTE — Telephone Encounter (Signed)
Patient called back in requesting refill and pharmacy of choice is :  [facility] [address], [place]

## 2019-08-30 ENCOUNTER — Encounter: Payer: Self-pay | Admitting: Internal Medicine

## 2019-08-30 ENCOUNTER — Ambulatory Visit (INDEPENDENT_AMBULATORY_CARE_PROVIDER_SITE_OTHER): Payer: 59 | Admitting: Internal Medicine

## 2019-08-30 ENCOUNTER — Other Ambulatory Visit: Payer: Self-pay

## 2019-08-30 DIAGNOSIS — E559 Vitamin D deficiency, unspecified: Secondary | ICD-10-CM | POA: Diagnosis not present

## 2019-08-30 DIAGNOSIS — E063 Autoimmune thyroiditis: Secondary | ICD-10-CM

## 2019-08-30 DIAGNOSIS — E038 Other specified hypothyroidism: Secondary | ICD-10-CM | POA: Diagnosis not present

## 2019-08-30 DIAGNOSIS — E538 Deficiency of other specified B group vitamins: Secondary | ICD-10-CM
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
## 2019-08-30 NOTE — Patient Instructions (Signed)
Please return for labs as soon as safe.  Please continue Levothyroxine 75 mcg daily.  Take the thyroid hormone every day, with water, at least 30 minutes before breakfast, separated by at least 4 hours from: - acid reflux medications - calcium - iron - multivitamins  Please come back for a follow-up appointment in 1 year.

## 2019-09-13 ENCOUNTER — Encounter: Payer: Self-pay | Admitting: Internal Medicine

## 2019-10-23 ENCOUNTER — Other Ambulatory Visit: Payer: Self-pay | Admitting: Internal Medicine

## 2019-10-23 MED ORDER — LEVOTHYROXINE SODIUM 75 MCG PO TABS: 75.0000 ug | ORAL_TABLET | Freq: Every day | ORAL | 0 refills | Status: AC
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
